# Patient Record
Sex: Female | Born: 1935
Health system: Southern US, Community
[De-identification: ages and names within clinical notes are randomized; demographics above are authoritative.]

## PROBLEM LIST (undated history)

## (undated) DIAGNOSIS — I10 Essential (primary) hypertension: Secondary | ICD-10-CM

## (undated) DIAGNOSIS — I272 Pulmonary hypertension, unspecified: Secondary | ICD-10-CM

## (undated) DIAGNOSIS — I4891 Unspecified atrial fibrillation: Secondary | ICD-10-CM

## (undated) DIAGNOSIS — I442 Atrioventricular block, complete: Secondary | ICD-10-CM

## (undated) DIAGNOSIS — J189 Pneumonia, unspecified organism: Secondary | ICD-10-CM

## (undated) HISTORY — DX: Atrioventricular block, complete: I44.2

## (undated) HISTORY — PX: ABDOMINAL HYSTERECTOMY: SHX81

## (undated) HISTORY — PX: BREAST SURGERY: SHX581

## (undated) HISTORY — PX: EYE SURGERY: SHX253

## (undated) HISTORY — DX: Pulmonary hypertension, unspecified: I27.20

---

## 2000-03-23 ENCOUNTER — Inpatient Hospital Stay (HOSPITAL_COMMUNITY): Admission: AD | Admit: 2000-03-23 | Discharge: 2000-03-24 | Payer: Self-pay | Admitting: Cardiology

## 2003-10-03 ENCOUNTER — Inpatient Hospital Stay (HOSPITAL_COMMUNITY): Admission: RE | Admit: 2003-10-03 | Discharge: 2003-10-07 | Payer: Self-pay | Admitting: Obstetrics and Gynecology

## 2005-02-23 IMAGING — CT CT ABDOMEN W/ CM
1 of 3 series · 13 of 32 positions shown, 18 images · IV contrast (CONTRAST)
Comparison: none

CLINICAL DATA: Postop day three, status post hysterectomy.  
 CT ABDOMEN AND PELVIS WITH CONTRAST 10/06/03 AT 0077 HOURS
TECHNIQUE: After the intravenous injection of Omnipaque 300, 5 mm spiral images were obtained through the abdomen and pelvis.  
 ABDOMEN

[Series 9095: — · axial · 0.70mm/px · z∈[+1330,+1755]mm · 13 of 99 slices shown, 18 images]
[im 7/99  soft-tissue]
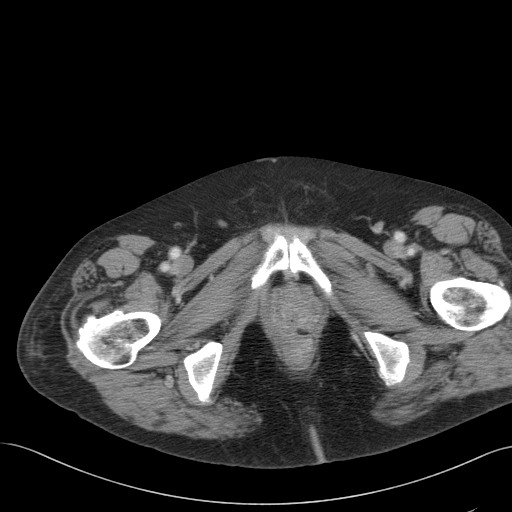
[im 7/99  bone]
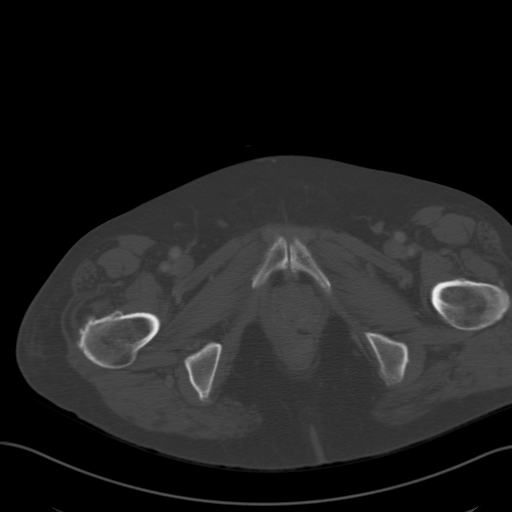
[im 13/99  soft-tissue]
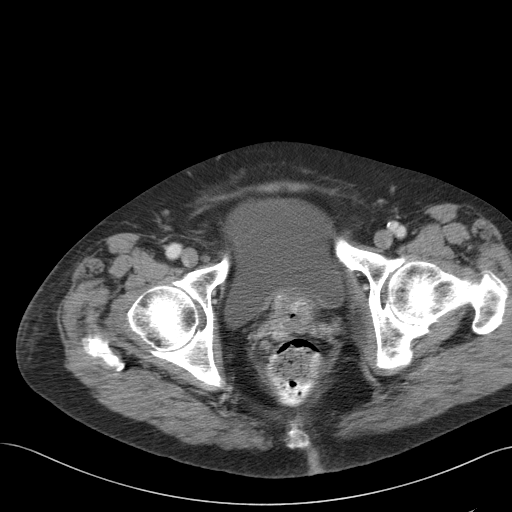
[im 25/99  soft-tissue]
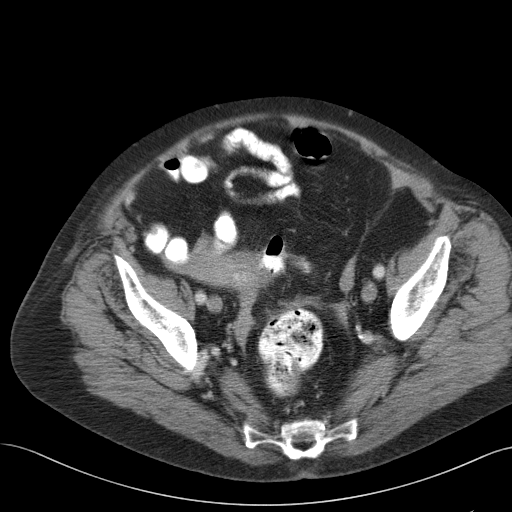
[im 31/99  soft-tissue]
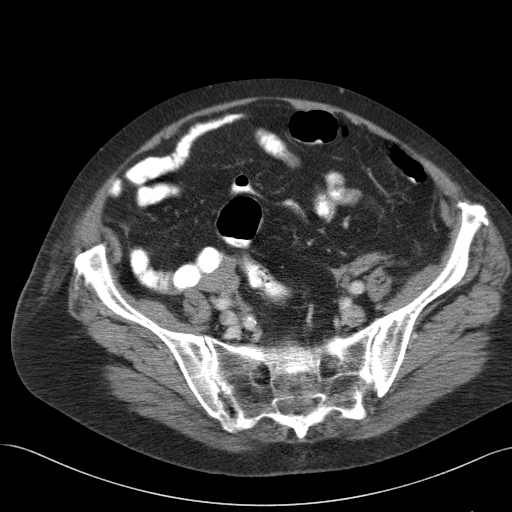
[im 37/99  soft-tissue]
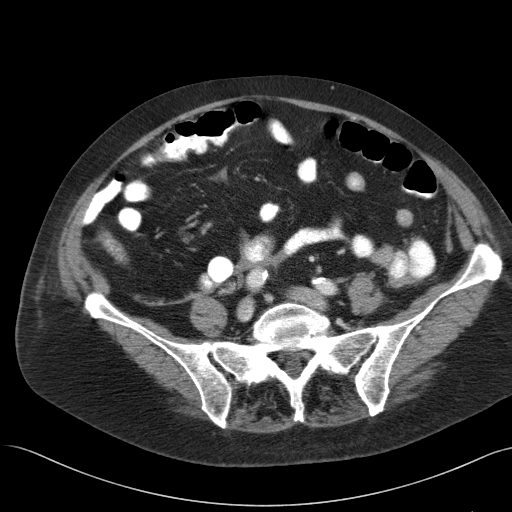
[im 43/99  soft-tissue]
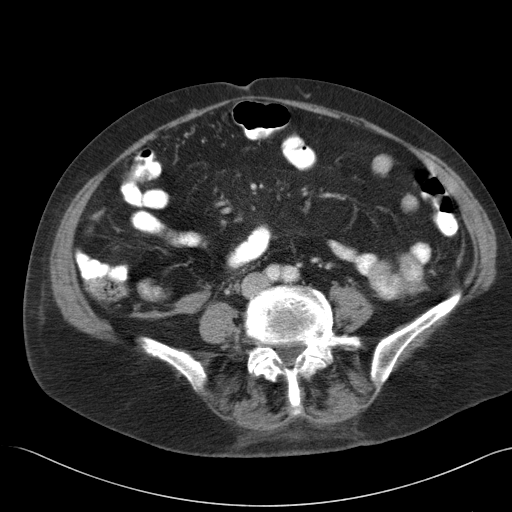
[im 56/99  soft-tissue]
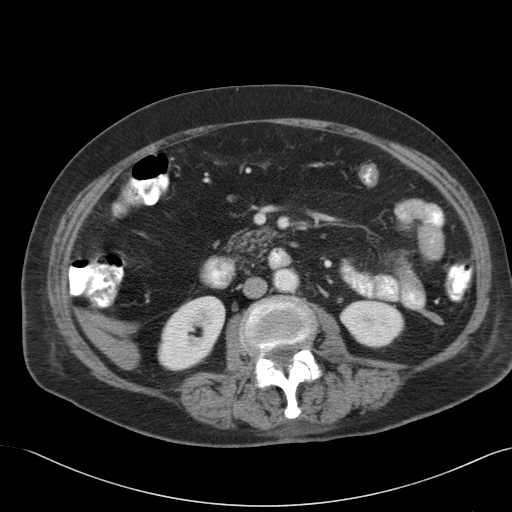
[im 62/99  soft-tissue]
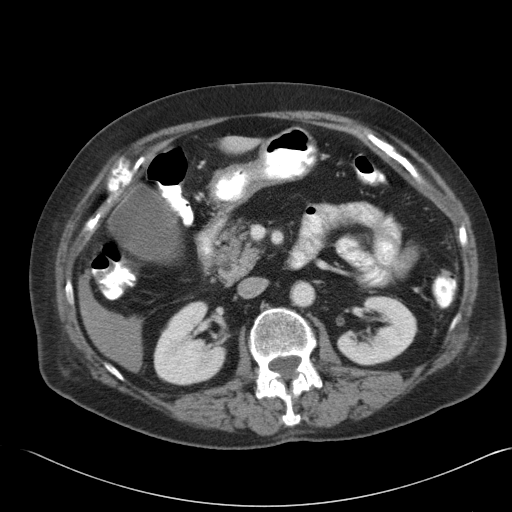
[im 68/99  soft-tissue]
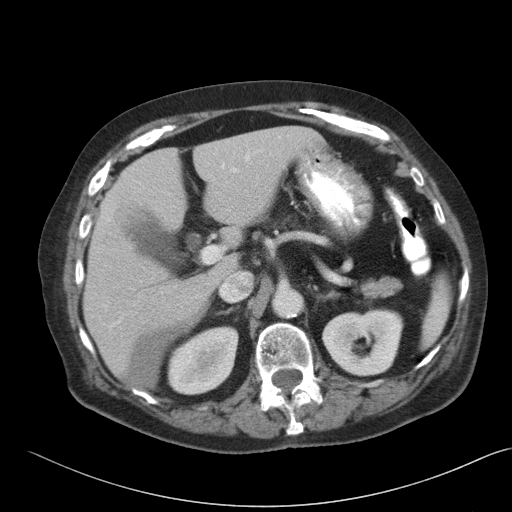
[im 68/99  bone]
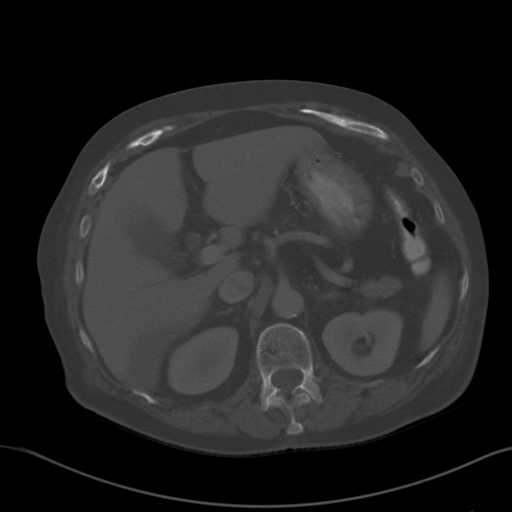
[im 74/99  soft-tissue]
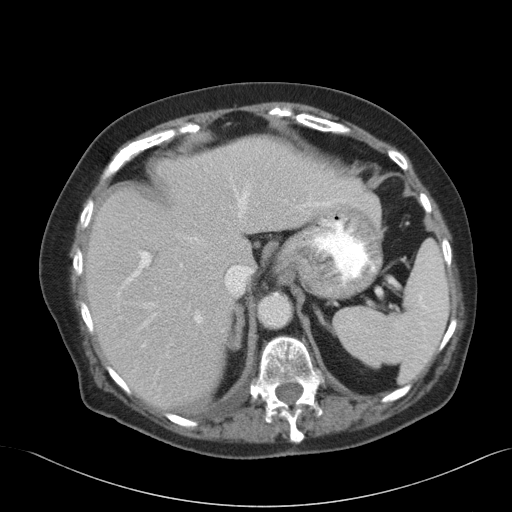
[im 74/99  lung]
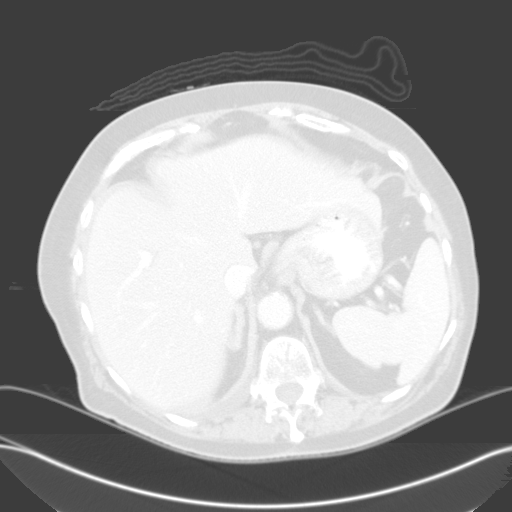
[im 80/99  lung]
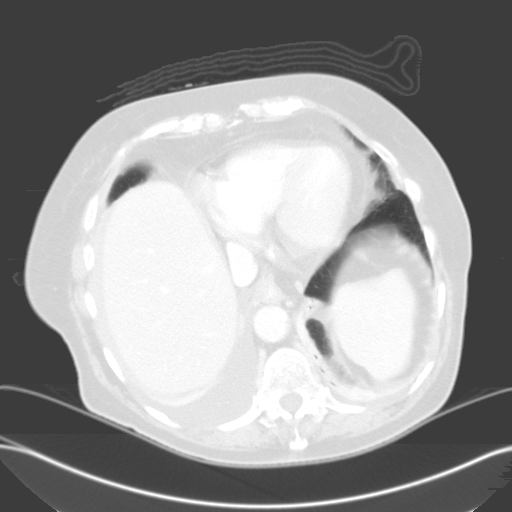
[im 86/99  soft-tissue]
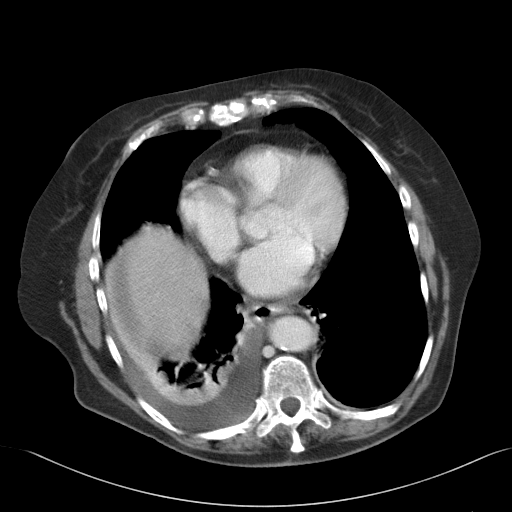
[im 86/99  lung]
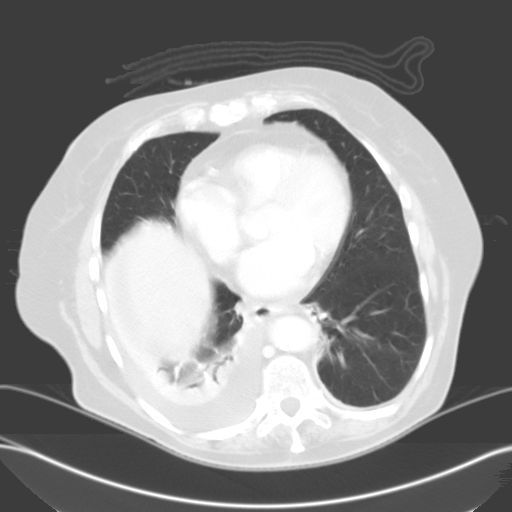
[im 92/99  soft-tissue]
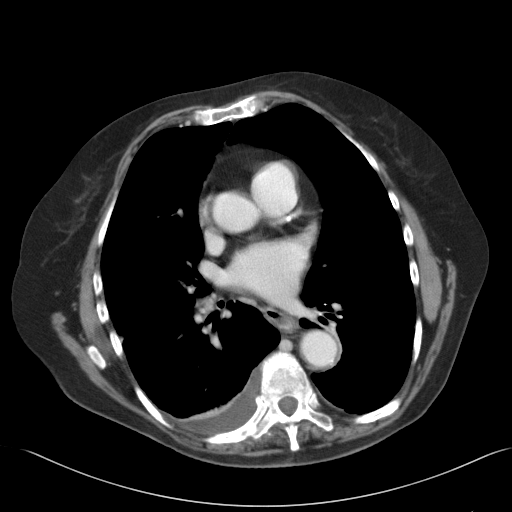
[im 92/99  lung]
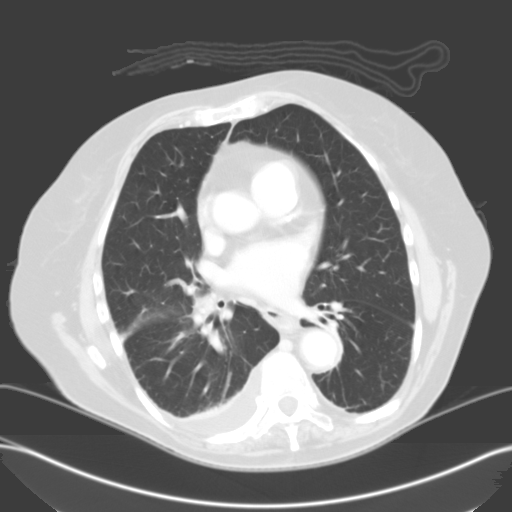

[13 of 32 positions shown; findings below may reference images not displayed]

FINDINGS: A small right pleural effusion is present with atelectatic change at the right base.  A tiny left effusion is present with minimal dependent atelectatic change at the left base.  Mild stranding is seen adjacent to the gallbladder with mild wall thickening of the gallbladder.  The gallbladder is somewhat distended.  High density free intraperitoneal fluid is seen in the right pericolic gutter.  The liver, spleen, pancreas are within normal limits.  A 1.8 X 0.9 cm right adrenal nodule is noted on image 28.  The common bile duct is prominent measuring 8 mm in caliber.  Negative abnormal adenopathy.  A subcentimeter hypodensity is seen in the right kidney on image 34.  This does not have the appearance of a simple cyst.  Solid mass is not excluded.  A small left adrenal nodule is also suspected.  
 IMPRESSION
 1.  High density free fluid in the right pericolic gutter which may simply represent postoperative hemorrhage. Extravasation from bowel contents is not entirely excluded.  Correlate clinically. 
 2.  Stranding and thickening of the gallbladder wall.  This may represent postsurgical change however inflammatory change of the gallbladder is not excluded.  Correlate clinically.  Common bile duct prominence is also noted. 
 3.  Bilateral effusions and bibasilar atelectasis.  
 4.  Hypodensity in the right kidney is seen.  Solid mass is not excluded.  Ultrasound is recommended.  
 PELVIS
 High density free fluid is seen layering in the pelvis.  Extending from the right adnexa into the right lower quadrant of the abdomen there is a heterogenous density within the free fluid.  Specks of high density material are seen and this may represent more acute hemorrhage.  The high density is not continuous and I do not think that this represents a retained sponge.  Gas is seen in the bladder.  The uterus is surgically absent.  The cervix has a somewhat heterogenous appearance and probably represents recent postsurgical state.  
 IMPRESSION
 1.  High density free fluid.  Please refer to abdomen CT for details.  
 2.  Heterogeneous high density within the high density free fluid likely more acute hemorrhage.  I do not think that this represents a retained lap sponge.  
 3.  Gas in the bladder. 
 4.  Postoperative appearance of the cervix.

## 2008-12-30 ENCOUNTER — Ambulatory Visit: Payer: Self-pay | Admitting: Cardiology

## 2011-01-28 HISTORY — PX: PACEMAKER INSERTION: SHX728

## 2011-02-04 ENCOUNTER — Inpatient Hospital Stay (HOSPITAL_COMMUNITY)
Admission: AD | Admit: 2011-02-04 | Discharge: 2011-02-10 | DRG: 244 | Disposition: A | Discharge status: Home Health | Payer: Medicare Other | Source: Other Acute Inpatient Hospital | Attending: Cardiology | Admitting: Cardiology

## 2011-02-04 ENCOUNTER — Other Ambulatory Visit: Payer: Self-pay

## 2011-02-04 ENCOUNTER — Inpatient Hospital Stay (HOSPITAL_COMMUNITY): Payer: Medicare Other

## 2011-02-04 ENCOUNTER — Encounter (HOSPITAL_COMMUNITY): Payer: Self-pay | Admitting: *Deleted

## 2011-02-04 DIAGNOSIS — R0789 Other chest pain: Secondary | ICD-10-CM | POA: Diagnosis present

## 2011-02-04 DIAGNOSIS — I4891 Unspecified atrial fibrillation: Secondary | ICD-10-CM | POA: Diagnosis present

## 2011-02-04 DIAGNOSIS — R5381 Other malaise: Secondary | ICD-10-CM | POA: Diagnosis present

## 2011-02-04 DIAGNOSIS — R002 Palpitations: Secondary | ICD-10-CM

## 2011-02-04 DIAGNOSIS — Z8249 Family history of ischemic heart disease and other diseases of the circulatory system: Secondary | ICD-10-CM

## 2011-02-04 DIAGNOSIS — E119 Type 2 diabetes mellitus without complications: Secondary | ICD-10-CM | POA: Diagnosis present

## 2011-02-04 DIAGNOSIS — I493 Ventricular premature depolarization: Secondary | ICD-10-CM | POA: Diagnosis present

## 2011-02-04 DIAGNOSIS — Z79899 Other long term (current) drug therapy: Secondary | ICD-10-CM

## 2011-02-04 DIAGNOSIS — R001 Bradycardia, unspecified: Secondary | ICD-10-CM | POA: Diagnosis present

## 2011-02-04 DIAGNOSIS — Z7982 Long term (current) use of aspirin: Secondary | ICD-10-CM

## 2011-02-04 DIAGNOSIS — R079 Chest pain, unspecified: Secondary | ICD-10-CM | POA: Diagnosis present

## 2011-02-04 DIAGNOSIS — I4949 Other premature depolarization: Secondary | ICD-10-CM | POA: Diagnosis present

## 2011-02-04 DIAGNOSIS — I10 Essential (primary) hypertension: Secondary | ICD-10-CM

## 2011-02-04 DIAGNOSIS — I498 Other specified cardiac arrhythmias: Principal | ICD-10-CM | POA: Diagnosis present

## 2011-02-04 DIAGNOSIS — Z7901 Long term (current) use of anticoagulants: Secondary | ICD-10-CM

## 2011-02-04 DIAGNOSIS — Z833 Family history of diabetes mellitus: Secondary | ICD-10-CM

## 2011-02-04 HISTORY — DX: Pneumonia, unspecified organism: J18.9

## 2011-02-04 HISTORY — DX: Unspecified atrial fibrillation: I48.91

## 2011-02-04 HISTORY — DX: Essential (primary) hypertension: I10

## 2011-02-04 LAB — MRSA PCR SCREENING: MRSA by PCR: NEGATIVE

## 2011-02-04 MED ORDER — DONEPEZIL HCL 5 MG PO TABS
5.0000 mg | ORAL_TABLET | Freq: Every day | ORAL | Status: DC
  Administered 2011-02-05 – 2011-02-09 (×5): 5 mg via ORAL
  Filled 2011-02-04 (×6): qty 1

## 2011-02-04 MED ORDER — NITROGLYCERIN 0.4 MG SL SUBL: 0.4000 mg | SUBLINGUAL_TABLET | SUBLINGUAL | Status: DC | PRN

## 2011-02-04 MED ORDER — SODIUM CHLORIDE 0.9 % IJ SOLN
3.0000 mL | Freq: Two times a day (BID) | INTRAMUSCULAR | Status: DC
  Administered 2011-02-05 – 2011-02-07 (×6): 3 mL via INTRAVENOUS

## 2011-02-04 MED ORDER — SODIUM CHLORIDE 0.9 % IV SOLN
250.0000 mL | INTRAVENOUS | Status: DC
  Administered 2011-02-08: 500 mL via INTRAVENOUS

## 2011-02-04 MED ORDER — SODIUM CHLORIDE 0.9 % IJ SOLN: 3.0000 mL | INTRAMUSCULAR | Status: DC | PRN

## 2011-02-04 MED ORDER — ENALAPRIL MALEATE 2.5 MG PO TABS
2.5000 mg | ORAL_TABLET | Freq: Every day | ORAL | Status: DC
  Administered 2011-02-05: 2.5 mg via ORAL
  Filled 2011-02-04 (×2): qty 1

## 2011-02-04 MED ORDER — BIOTENE DRY MOUTH MT LIQD
15.0000 mL | Freq: Two times a day (BID) | OROMUCOSAL | Status: DC
  Administered 2011-02-04 – 2011-02-10 (×11): 15 mL via OROMUCOSAL

## 2011-02-04 MED ORDER — INSULIN ASPART 100 UNIT/ML ~~LOC~~ SOLN
0.0000 [IU] | Freq: Three times a day (TID) | SUBCUTANEOUS | Status: DC
  Administered 2011-02-05: 2 [IU] via SUBCUTANEOUS
  Administered 2011-02-07: 1 [IU] via SUBCUTANEOUS
  Filled 2011-02-04: qty 3

## 2011-02-04 MED ORDER — DOCUSATE SODIUM 100 MG PO CAPS
100.0000 mg | ORAL_CAPSULE | Freq: Every evening | ORAL | Status: DC | PRN
  Administered 2011-02-05 – 2011-02-09 (×3): 100 mg via ORAL
  Filled 2011-02-04 (×4): qty 1

## 2011-02-04 MED ORDER — ONDANSETRON HCL 4 MG/2ML IJ SOLN: 4.0000 mg | Freq: Four times a day (QID) | INTRAMUSCULAR | Status: DC | PRN

## 2011-02-04 MED ORDER — CALCIUM CARBONATE-VITAMIN D 250-125 MG-UNIT PO TABS
1.0000 | ORAL_TABLET | Freq: Every day | ORAL | Status: DC
  Filled 2011-02-04: qty 1

## 2011-02-04 MED ORDER — ACETAMINOPHEN 325 MG PO TABS: 650.0000 mg | ORAL_TABLET | ORAL | Status: DC | PRN

## 2011-02-04 MED ORDER — ASPIRIN EC 325 MG PO TBEC
325.0000 mg | DELAYED_RELEASE_TABLET | Freq: Every day | ORAL | Status: DC
  Administered 2011-02-05: 325 mg via ORAL
  Filled 2011-02-04 (×2): qty 1

## 2011-02-04 MED ORDER — LORATADINE 10 MG PO TABS
10.0000 mg | ORAL_TABLET | Freq: Every day | ORAL | Status: DC
  Administered 2011-02-05 – 2011-02-10 (×6): 10 mg via ORAL
  Filled 2011-02-04 (×6): qty 1

## 2011-02-04 MED ORDER — HYDROCHLOROTHIAZIDE 12.5 MG PO CAPS
12.5000 mg | ORAL_CAPSULE | Freq: Every day | ORAL | Status: DC
  Administered 2011-02-05: 12.5 mg via ORAL
  Filled 2011-02-04 (×2): qty 1

## 2011-02-04 MED ORDER — PANTOPRAZOLE SODIUM 40 MG PO TBEC
40.0000 mg | DELAYED_RELEASE_TABLET | Freq: Every day | ORAL | Status: DC
  Administered 2011-02-05 – 2011-02-10 (×6): 40 mg via ORAL
  Filled 2011-02-04 (×6): qty 1

## 2011-02-04 MED ORDER — HEPARIN SODIUM (PORCINE) 5000 UNIT/ML IJ SOLN
5000.0000 [IU] | Freq: Three times a day (TID) | INTRAMUSCULAR | Status: DC
Start: 2011-02-04 — End: 2011-02-05
  Administered 2011-02-05 (×2): 5000 [IU] via SUBCUTANEOUS
  Filled 2011-02-04 (×5): qty 1

## 2011-02-04 NOTE — H&P (Signed)
Cardiology H&P  PCP: Selinda Flavin Cardiologist:   Clinical Summary Ms. Lovin is a 77 y.o.female with atrial fibrillation, HTN, and history of noncardiac chest pain who is transferred from [facility] with 2-3 months of intermittent palpitaitons and fluttering in her chest.  In addition, tonight she noted chest pressure that lasted for 20-30 minutes associated with the fluttering.  She does not think her heart was racing.  She reports recent feelings of weakness and occasional lightheadedness but has not passed out.  She has not had any SOB, orthopnea, or PND.  She has not had any exertional angina.  She does not remember the symptoms she was having at the time of her LHC in 2001 but she was found to have nonobstructive dz but TIMI II flow in the LAD and RCA thought to be due to small vessel dz.   No Known Allergies  Home Medications Prescriptions prior to admission  Medication Sig Dispense Refill  . aspirin 325 MG tablet Take 325 mg by mouth daily.        Marland Kitchen b complex vitamins tablet Take 1 tablet by mouth daily.        . Calcium Carbonate-Vitamin D (CALCIUM + D PO) Take by mouth.        . diltiazem (CARDIZEM CD) 180 MG 24 hr capsule Take 180 mg by mouth daily.        . Enalapril Maleate (VASOTEC PO) Take by mouth.        . esomeprazole (NEXIUM) 40 MG capsule Take 40 mg by mouth daily before breakfast.        . glipiZIDE (GLUCOTROL) 5 MG tablet Take 5 mg by mouth daily before breakfast.       . hydrochlorothiazide (MICROZIDE) 12.5 MG capsule Take 12.5 mg by mouth daily.        Marland Kitchen loratadine (CLARITIN) 10 MG tablet Take 10 mg by mouth daily.        . Multiple Vitamin (MULTIVITAMIN) capsule Take 1 capsule by mouth daily.        Marland Kitchen docusate sodium (COLACE) 100 MG capsule Take 100 mg by mouth at bedtime as needed.        . donepezil (ARICEPT) 10 MG tablet Take 10 mg by mouth every other day.        . donepezil (ARICEPT) 5 MG tablet Take 5 mg by mouth every other day.        .  nitrofurantoin, macrocrystal-monohydrate, (MACROBID) 100 MG capsule Take 100 mg by mouth daily.          Past Medical History  Diagnosis Date  . Diabetes mellitus   . Pneumonia   . Atrial fibrillation     not on coumadin as outpatient  . Hypertension   . Chest pain 2001    nonobstructive dz by LHC in 2001    Past Surgical History  Procedure Date  . Abdominal hysterectomy   . Breast surgery     62 yrs ago after childbirth breast lanced  . Eye surgery     Cataract Sx     Family History  Problem Relation Age of Onset  . Diabetes Father   . Coronary artery disease Brother     AMI in 86s    Social History Ms. Platter reports that she has never smoked. She does not have any smokeless tobacco history on file. Ms. Winslett reports that she does not drink alcohol.  Review of Systems Full review of systems is negative except as noted  in the h&p  Physical Examination Temp:  [98.1 F (36.7 C)] 98.1 F (36.7 C) (11/08 2115) Resp:  [9-22] 20  (11/08 2200) BP: (128-131)/(50-55) 128/55 mmHg (11/08 2200) SpO2:  [94 %-98 %] 98 % (11/08 2200) Weight:  [64 kg (141 lb 1.5 oz)] 141 lb 1.5 oz (64 kg) (11/08 2115)  Gen: NAD, elderly HEENT: normocephalic, atraumatic, EOMI, nonicteric sclera Neck: supple Lymph: no LAD CV: RRR with soft 2/6 holosystolic murmur at apex, no JVD Lungs: CTA biltaterally Abd: soft/nt/nd Ext: no edema, intact distal pulses Neuro: answers questions well, moves all ext well, CN intact Skin: no rashes  Testing Labs pending, Outside records reviewed, hgb/hct normal, renal function normal, cardiac enzymes x 1 normal  ECG Atrial fibrillation with bigeminy, slow ventricular response, nonspecific TWA  Imaging CXR pending  A/P:  77 yo WF with chronic atrial fibrillation and HTN here with chest pressure and intermittent palpitations as well as what appears to be symptomatic bradycardia.  I suspect most of her symptoms are due to bradycardia but will also need to  consider ACS.  1. Bradycardia and chronic afib: presyncope and fatigue.  HR in the 30s to 40s.   - d/c home diltiazem 180 daily - telemetry - will probably need PPM, particuarly if she proves to have RVR/bradycardia - check 2D TTE in AM - will need to discuss coumadin with patient/family, no definite contraindications and CHADS2-Vasc score is 5.  She is currently on aspirin from her PCP  2. Chest pain: suspect this is related to her bradycardia but could also represent unstable angina - r/o for AMI with serial markers - ASA 325 daily - will not anticoagulate unless pain returns or enzymes abn  3. DM:  - Ha1c - will hold glipizide while she is NPO in the AM  4. HTN: well controlled - will continue HCTZ as well as enalapril 2.5mg  (home does unknown currently but daughters will get dose in AM)

## 2011-02-04 NOTE — Progress Notes (Signed)
Received pt via [facility] from [facility]. Dr. Charm Barges notified of arrival. Family at bedside.

## 2011-02-05 DIAGNOSIS — I369 Nonrheumatic tricuspid valve disorder, unspecified: Secondary | ICD-10-CM

## 2011-02-05 DIAGNOSIS — I4891 Unspecified atrial fibrillation: Secondary | ICD-10-CM

## 2011-02-05 DIAGNOSIS — I498 Other specified cardiac arrhythmias: Secondary | ICD-10-CM

## 2011-02-05 LAB — COMPREHENSIVE METABOLIC PANEL
ALT: 23 U/L (ref 0–35)
AST: 30 U/L (ref 0–37)
Alkaline Phosphatase: 101 U/L (ref 39–117)
Calcium: 9 mg/dL (ref 8.4–10.5)
Potassium: 4.3 mEq/L (ref 3.5–5.1)
Sodium: 141 mEq/L (ref 135–145)
Total Protein: 5.9 g/dL — ABNORMAL LOW (ref 6.0–8.3)

## 2011-02-05 LAB — LIPID PANEL
Cholesterol: 132 mg/dL (ref 0–200)
HDL: 71 mg/dL (ref >39)
Total CHOL/HDL Ratio: 1.9 RATIO
Triglycerides: 33 mg/dL (ref <150)
VLDL: 7 mg/dL (ref 0–40)

## 2011-02-05 LAB — TSH: TSH: 5.561 u[IU]/mL — ABNORMAL HIGH (ref 0.350–4.500)

## 2011-02-05 LAB — APTT: aPTT: 38 seconds — ABNORMAL HIGH (ref 24–37)

## 2011-02-05 LAB — DIFFERENTIAL
Basophils Relative: 0 % (ref 0–1)
Eosinophils Absolute: 0.1 10*3/uL (ref 0.0–0.7)
Lymphs Abs: 1.7 10*3/uL (ref 0.7–4.0)
Neutro Abs: 2.6 10*3/uL (ref 1.7–7.7)
Neutrophils Relative %: 51 % (ref 43–77)

## 2011-02-05 LAB — CBC
Hemoglobin: 12.8 g/dL (ref 12.0–15.0)
MCH: 30.3 pg (ref 26.0–34.0)
Platelets: 205 10*3/uL (ref 150–400)
RBC: 4.23 MIL/uL (ref 3.87–5.11)

## 2011-02-05 LAB — CARDIAC PANEL(CRET KIN+CKTOT+MB+TROPI)
CK, MB: 2.8 ng/mL (ref 0.3–4.0)
CK, MB: 2.2 ng/mL (ref 0.3–4.0)
Total CK: 47 U/L (ref 7–177)
Troponin I: <0.3 ng/mL (ref <0.30)

## 2011-02-05 LAB — PROTIME-INR
INR: 1.19 (ref 0.00–1.49)
Prothrombin Time: 15.4 seconds — ABNORMAL HIGH (ref 11.6–15.2)

## 2011-02-05 LAB — GLUCOSE, CAPILLARY: Glucose-Capillary: 89 mg/dL (ref 70–99)

## 2011-02-05 MED ORDER — HEPARIN (PORCINE) IN NACL 100-0.45 UNIT/ML-% IJ SOLN
12.0000 [IU]/kg/h | INTRAMUSCULAR | Status: DC
  Administered 2011-02-05: 12 [IU]/kg/h via INTRAVENOUS
  Filled 2011-02-05: qty 250

## 2011-02-05 MED ORDER — CALCIUM CARBONATE 1250 (500 CA) MG PO TABS
0.5000 | ORAL_TABLET | Freq: Every day | ORAL | Status: DC
  Administered 2011-02-05 – 2011-02-10 (×6): 250 mg via ORAL
  Filled 2011-02-05 (×7): qty 0.5

## 2011-02-05 MED ORDER — HEPARIN BOLUS VIA INFUSION
1500.0000 [IU] | Freq: Once | INTRAVENOUS | Status: AC
  Administered 2011-02-05: 1500 [IU] via INTRAVENOUS

## 2011-02-05 NOTE — Progress Notes (Signed)
ANTICOAGULATION CONSULT NOTE - Initial Consult  Pharmacy Consult for Heparin Indication: chest pain/ACS  No Known Allergies  Patient Measurements: Height: 5\' 6"  (167.6 cm) Weight: 141 lb 1.5 oz (64 kg) IBW/kg (Calculated) : 59.3   Vital Signs: Temp: 98.4 F (36.9 C) (11/09 0800) Temp src: Oral (11/09 0800) BP: 151/61 mmHg (11/09 0900)  Labs:  Basename 02/05/11 0556 02/05/11 0004 02/05/11 0003  HGB -- -- 12.8  HCT -- -- 37.8  PLT -- -- 205  APTT -- -- 38*  LABPROT -- -- 15.4*  INR -- -- 1.19  HEPARINUNFRC -- -- --  CREATININE -- -- 0.69  CKTOTAL 47 47 --  CKMB 2.2 2.8 --  TROPONINI <0.30 <0.30 --   Estimated Creatinine Clearance: 42.9 ml/min (by C-G formula based on Cr of 0.69).  Medical History: Past Medical History  Diagnosis Date  . Diabetes mellitus   . Pneumonia   . Atrial fibrillation     not on coumadin as outpatient  . Hypertension   . Chest pain 2001    nonobstructive dz by LHC in 2001    Medications:  Scheduled:    . antiseptic oral rinse  15 mL Mouth Rinse BID  . aspirin EC  325 mg Oral Daily  . calcium carbonate  0.5 tablet Oral Daily  . donepezil  5 mg Oral QHS  . enalapril  2.5 mg Oral Daily  . heparin  1,500 Units Intravenous Once  . hydrochlorothiazide  12.5 mg Oral Daily  . insulin aspart  0-9 Units Subcutaneous TID WC  . loratadine  10 mg Oral Daily  . pantoprazole  40 mg Oral Daily  . sodium chloride  3 mL Intravenous Q12H  . DISCONTD: calcium-vitamin D  1 tablet Oral Daily  . DISCONTD: heparin  5,000 Units Subcutaneous Q8H   Infusions:    . sodium chloride 250 mL (02/05/11 0800)  . heparin      Assessment: [name] is a 77 year old woman with chest pain who cannot be ruled out for unstable angina/NSTEMI with atrial fibrillation (CHADS2=3). ECHO is pending. Chest pain is atypical and CE negative as of now. Bradycardia with heart rates in the 30-40 range as of now. CBC okay for now, no signs of bleeding. Will give bolus of 1500  units (~1/2 bolus) d/t heparin sq dose this am and patient's age. Goal of Therapy:  Heparin level 0.3-0.7 units/ml   Plan:  1500 units bolus Drip at 750 units (7.5 ml) per hour Heparin level at 1800 Daily heparin level Daily CBC  Katrinka Blazing, [place] R 02/05/2011,10:26 AM

## 2011-02-05 NOTE — Progress Notes (Signed)
As above, patient seen and examined; she complains of weakness and chest heaviness; she remains in atrial fibrillation with slow ventricular response. She took her last dose of cardizem yesterday AM. Hold and follow telemetry; she may eventually need pacemaker. Multiple embolic risk factors; add heparin; she will ultimately need coumadin. Await echo results. Her chest pain is atypical and enzymes negative. Olga Millers

## 2011-02-05 NOTE — Progress Notes (Signed)
  Echocardiogram 2D Echocardiogram has been performed.  HAIRSTON, Arrayah Connors 02/05/2011, 6:04 PM

## 2011-02-05 NOTE — Progress Notes (Signed)
ANTICOAGULATION CONSULT NOTE - Follow Up Consult  Pharmacy Consult for heparin Indication: chest pain/ACS  No Known Allergies  Patient Measurements: Height: 5\' 6"  (167.6 cm) Weight: 141 lb 1.5 oz (64 kg) IBW/kg (Calculated) : 59.3  Adjusted Body Weight:    Vital Signs: Temp: 98 F (36.7 C) (11/09 1900) Temp src: Oral (11/09 1900) BP: 161/52 mmHg (11/09 1900)  Labs:  Basename 02/05/11 1757 02/05/11 0556 02/05/11 0004 02/05/11 0003  HGB -- -- -- 12.8  HCT -- -- -- 37.8  PLT -- -- -- 205  APTT -- -- -- 38*  LABPROT -- -- -- 15.4*  INR -- -- -- 1.19  HEPARINUNFRC 0.32 -- -- --  CREATININE -- -- -- 0.69  CKTOTAL -- 47 47 --  CKMB -- 2.2 2.8 --  TROPONINI -- <0.30 <0.30 --   Estimated Creatinine Clearance: 42.9 ml/min (by C-G formula based on Cr of 0.69).   Medications:  Scheduled:    . antiseptic oral rinse  15 mL Mouth Rinse BID  . aspirin EC  325 mg Oral Daily  . calcium carbonate  0.5 tablet Oral Daily  . donepezil  5 mg Oral QHS  . enalapril  2.5 mg Oral Daily  . heparin  1,500 Units Intravenous Once  . hydrochlorothiazide  12.5 mg Oral Daily  . insulin aspart  0-9 Units Subcutaneous TID WC  . loratadine  10 mg Oral Daily  . pantoprazole  40 mg Oral Daily  . sodium chloride  3 mL Intravenous Q12H  . DISCONTD: calcium-vitamin D  1 tablet Oral Daily  . DISCONTD: heparin  5,000 Units Subcutaneous Q8H   Infusions:    . sodium chloride 250 mL (02/05/11 1900)  . heparin 12 Units/kg/hr (02/05/11 1049)    Assessment: 77 yo who was admitted for CP. Heparin is therapeutic. Will increase dose slightly.  Goal of Therapy:  Heparin level 0.3-0.7 units/ml   Plan:  infusion rate 850 units/hr F/u with AM heparin level  Ulyses Southward [place] 02/05/2011,7:53 PM

## 2011-02-05 NOTE — Progress Notes (Addendum)
Subjective:  She reports some heaviness in the left side of her chest. Denies any SOB or dizziness.  Objective:  Vital Signs in the last 24 hours: Temp:  [98 F (36.7 C)-98.2 F (36.8 C)] 98 F (36.7 C) (11/09 0400) Resp:  [9-22] 18  (11/09 0600) BP: (128-157)/(48-55) 153/52 mmHg (11/09 0600) SpO2:  [94 %-98 %] 97 % (11/09 0600) Weight:  [141 lb 1.5 oz (64 kg)] 141 lb 1.5 oz (64 kg) (11/08 2115)  Intake/Output from previous day: 11/08 0701 - 11/09 0700 In: 200 [P.O.:120; I.V.:80] Out: 350 [Urine:350]  Physical Exam: Pt is alert and oriented, NAD HEENT: normal Neck: JVP - normal, carotids 2+= without bruits Lungs: CTA bilaterally CV: bradycardia, irregular rhythm without murmur or gallop Abd: soft, NT, Positive BS, no hepatomegaly Ext: no C/C/E, distal pulses intact and equal Skin: warm/dry no rash   Lab Results:  Basename 02/05/11 0003  WBC 5.0  HGB 12.8  PLT 205    Basename 02/05/11 0003  NA 141  K 4.3  CL 105  CO2 26  GLUCOSE 90  BUN 13  CREATININE 0.69    Basename 02/05/11 0004  TROPONINI <0.30   Hepatic Function Panel  Basename 02/05/11 0003  PROT 5.9*  ALBUMIN 3.3*  AST 30  ALT 23  ALKPHOS 101  BILITOT 0.5  BILIDIR --  IBILI --   No results found for this basename: CHOL in the last 72 hours No results found for this basename: PROTIME in the last 72 hours  Imaging: 11/8; CXR: cardiomegaly without edema.  Cardiac Studies: LHC in 2001 but she was found to have nonobstructive dz but TIMI II flow in the LAD and RCA thought to be due to small vessel dz  EKG : atrial fibrillation with slow ventricular response. Assessment/Plan:  [name] is a 77 y.o.female with atrial fibrillation, HTN, and history of noncardiac chest pain who is transferred from [facility]  on 11/8 with 2-3 months of intermittent palpitaitons and fluttering in her chest. In addition,she also reports having some chest discomfort that she describes as pressure like pain starting  on the day of her admsission  1.Bradycardia with chronic atrial fibrillation:  (02/04/2011): Her HR continues to be in 30- 40 's. Her diltiazem was held with the admission yesterday.    Plan: F/U 2 D echo.            Given her CHADS2 Vasc score of 5( 1= HYPERTENSION, 1= DM,1= female gender,  2=Age), will discuss with the patient/ family about coumadin.            Given her symptomatic bradycardia, she would need permanent pacemaker.   2.Chest pain (02/04/2011): Likely related to bradycardia but unstable angina can not be ruled out. She continues to feel chest pressure this morning. First set of CE has been negative      Plan: Follow up CE            Continue ASA  3. DM: CBG's well controlled.     Plan: Continue SSI               F/U AIC  4. HTN: BP stable on home meds.               Continue home meds.           [name], M.D. 02/05/2011, 6:11 AM

## 2011-02-06 DIAGNOSIS — I10 Essential (primary) hypertension: Secondary | ICD-10-CM

## 2011-02-06 LAB — GLUCOSE, CAPILLARY
Glucose-Capillary: 73 mg/dL (ref 70–99)
Glucose-Capillary: 125 mg/dL — ABNORMAL HIGH (ref 70–99)

## 2011-02-06 LAB — HEPARIN LEVEL (UNFRACTIONATED): Heparin Unfractionated: 0.22 IU/mL — ABNORMAL LOW (ref 0.30–0.70)

## 2011-02-06 LAB — CBC
HCT: 40.1 % (ref 36.0–46.0)
RBC: 4.46 MIL/uL (ref 3.87–5.11)
RDW: 15.4 % (ref 11.5–15.5)
WBC: 5.6 10*3/uL (ref 4.0–10.5)

## 2011-02-06 MED ORDER — HYDROCHLOROTHIAZIDE 12.5 MG PO CAPS: 25.0000 mg | ORAL_CAPSULE | Freq: Every day | ORAL | Status: DC

## 2011-02-06 MED ORDER — HYDROCHLOROTHIAZIDE 25 MG PO TABS
25.0000 mg | ORAL_TABLET | Freq: Every day | ORAL | Status: DC
  Administered 2011-02-06 – 2011-02-10 (×5): 25 mg via ORAL
  Filled 2011-02-06 (×5): qty 1

## 2011-02-06 MED ORDER — HEPARIN (PORCINE) IN NACL 100-0.45 UNIT/ML-% IJ SOLN
1050.0000 [IU]/h | INTRAMUSCULAR | Status: DC
  Administered 2011-02-06 (×2): 850 [IU]/h via INTRAVENOUS
  Administered 2011-02-07: 950 [IU]/h via INTRAVENOUS
  Filled 2011-02-06 (×4): qty 250

## 2011-02-06 MED ORDER — LISINOPRIL 10 MG PO TABS
10.0000 mg | ORAL_TABLET | Freq: Every day | ORAL | Status: DC
  Administered 2011-02-06 – 2011-02-10 (×5): 10 mg via ORAL
  Filled 2011-02-06 (×5): qty 1

## 2011-02-06 NOTE — Plan of Care (Signed)
Problem: Phase I Progression Outcomes Goal: Initial discharge plan identified For possible pacemaker on Monday then possible discharge Tues.

## 2011-02-06 NOTE — Progress Notes (Signed)
[name]  77 y.o.  female  Subjective: Continues to note vague intermittent chest heaviness; no dyspnea; no lightheadedness or syncope.  + weakness and fatigue.  Allergy: Review of patient's allergies indicates no known allergies.  Objective: Vital signs in last 24 hours: Temp:  [97.5 F (36.4 C)-98.3 F (36.8 C)] 98.1 F (36.7 C) (11/10 0700) Resp:  [15-21] 17  (11/10 0800) BP: (133-186)/(44-92) 186/54 mmHg (11/10 0700) SpO2:  [92 %-98 %] 96 % (11/10 0800)  141 lb 1.5 oz (64 kg) Body mass index is 22.77 kg/(m^2).  Weight change:  Last BM Date: 02/05/11  Intake/Output from previous day: 11/09 0701 - 11/10 0700 In: 1420 [P.O.:1000; I.V.:420] Out: 1950 [Urine:1950]  General-Well developed; no acute distress; thin Neck-No JVD, no carotid bruits Lungs: few basilar rales; normal I:E ratio; mild kyphosis Cardiovascular-normal PMI; normal S1 and S2; G2/6 holosystolic apical murmur. Abdomen-normal bowel sounds; soft and non-tender without masses or organomegaly Skin-Warm, no significant lesions; mild chronic stasis changes in LEs Extremities-distal pulses intact; 1/2+ edema  Telemetry:  AF, ventricular rate approximately 50 and slightly increased  Lab Results: Cardiac Markers:   Basename 02/05/11 0556 02/05/11 0004  TROPONINI <0.30 <0.30   CBC:   Basename 02/06/11 0545 02/05/11 0003  WBC 5.6 5.0  HGB 13.2 12.8  HCT 40.1 37.8  PLT 214 205   BMET:  Basename 02/05/11 0003  NA 141  K 4.3  CL 105  CO2 26  GLUCOSE 90  BUN 13  CREATININE 0.69  CALCIUM 9.0   Hepatic Function:   Basename 02/05/11 0003  PROT 5.9*  ALBUMIN 3.3*  AST 30  ALT 23  ALKPHOS 101  BILITOT 0.5  BILIDIR --  IBILI --   GFR:  Estimated Creatinine Clearance: 42.9 ml/min (by C-G formula based on Cr of 0.69). Lipids:   Basename 02/05/11 0556  CHOL 132  TRIG 33  HDL 71    EKG:  AF, slow response; low voltage in limb leads; NSSTT abnormality-inferior leads.   Imaging  Studies/Results: Dg Chest Portable 1 View  02/05/2011  *RADIOLOGY REPORT*  Clinical Data: Chest pain  PORTABLE CHEST - 1 VIEW  Comparison: None.  Findings: The heart is markedly enlarged.  Pulmonary vascularity is within normal limits.  Thorax is rotated to the right. Mid and upper lungs are grossly clear.  No pneumothorax or pleural effusion. Mild bibasilar atelectasis.  IMPRESSION: Cardiomegaly without edema.  Original Report Authenticated By: Donavan Burnet, M.D.    Imaging: Imaging results have been reviewed  Medications: I have reviewed the patient's current medications.  Infusions:     . sodium chloride 250 mL (02/06/11 0900)  . heparin 750 Units (02/06/11 0900)   Assessment/Plan: Bradycardia persists, somewhat improved.  Chronic chest discomfort could be related to ischemia, either from decreased myocardial perfusion related to arrhythmia or CAD.  Doubt the former, as diastole is lengthened with these very slow heart rates.  Hypertension and pedal edema suboptimally controlled.  Will increase dose of ACE inhibitor and diuretic.    OK for transfer to floor.  Continued observation to determine if she will require pacing.  Will review echo and transfer to monitored bed.  LOS: 2 days   Ukiah Bing 02/06/2011, 10:12 AM

## 2011-02-06 NOTE — Progress Notes (Addendum)
ANTICOAGULATION CONSULT NOTE - Follow Up Consult  Pharmacy Consult for heparin Indication: chest pain/ACS  No Known Allergies  Patient Measurements: Height: 5\' 6"  (167.6 cm) Weight: 141 lb 1.5 oz (64 kg) IBW/kg (Calculated) : 59.3     Vital Signs: Temp: 98.1 F (36.7 C) (11/10 0700) Temp src: Oral (11/10 0700) BP: 164/52 mmHg (11/10 1100)  Labs:  Basename 02/06/11 0545 02/05/11 1757 02/05/11 0556 02/05/11 0004 02/05/11 0003  HGB 13.2 -- -- -- 12.8  HCT 40.1 -- -- -- 37.8  PLT 214 -- -- -- 205  APTT -- -- -- -- 38*  LABPROT -- -- -- -- 15.4*  INR -- -- -- -- 1.19  HEPARINUNFRC 0.22* 0.32 -- -- --  CREATININE -- -- -- -- 0.69  CKTOTAL -- -- 47 47 --  CKMB -- -- 2.2 2.8 --  TROPONINI -- -- <0.30 <0.30 --   Estimated Creatinine Clearance: 42.9 ml/min (by C-G formula based on Cr of 0.69).    Assessment: 77 yo who was admitted for CP. Heparin level 0.22 < goal 0.3-0.7 drip rate 750 uts/hr.   H/H stable no bleeding noted.  Still c/o slight CP with neg cardiac enzymes.  BP slightly elevated - inc ACEI, LE edema inc HCTZ -per cards  Goal of Therapy:  Heparin level 0.3-0.7 units/ml   Plan:  Increase Heparin drip 850 uts/hr check AML  [name] 02/06/2011,11:52 AM

## 2011-02-07 LAB — T4, FREE: Free T4: 1.36 ng/dL (ref 0.80–1.80)

## 2011-02-07 LAB — CBC
HCT: 41.8 % (ref 36.0–46.0)
Hemoglobin: 14.3 g/dL (ref 12.0–15.0)
MCH: 30.4 pg (ref 26.0–34.0)
MCHC: 34.2 g/dL (ref 30.0–36.0)

## 2011-02-07 LAB — GLUCOSE, CAPILLARY
Glucose-Capillary: 110 mg/dL — ABNORMAL HIGH (ref 70–99)
Glucose-Capillary: 118 mg/dL — ABNORMAL HIGH (ref 70–99)

## 2011-02-07 NOTE — Progress Notes (Signed)
Night shift RN reported bleeding from a small puncture like wound on RT lower Abd.  Pt states she thought she had "wet the bed" and when she awoke, there was blood everywhere.  Pressure dsg was applied and bleeding stopped.  Pharmacist and Gladis Riffle, NP notified this am.  Hep gtt currently infusing at 8.5cc/hr.   Sakshi Sermons, [place] Erp

## 2011-02-07 NOTE — Progress Notes (Signed)
ANTICOAGULATION CONSULT NOTE - Follow Up Consult  Pharmacy Consult for Heparin Indication: chest pain/ACS  No Known Allergies  Patient Measurements: Height: 5\' 6"  (167.6 cm) Weight: 138 lb 10.7 oz (62.9 kg) IBW/kg (Calculated) : 59.3   Vital Signs: Temp: 97.5 F (36.4 C) (11/11 0613) Temp src: Oral (11/11 0613) BP: 151/71 mmHg (11/11 0944) Pulse Rate: 45  (11/11 0944)  Labs:  Basename 02/07/11 0700 02/06/11 0545 02/05/11 1757 02/05/11 0556 02/05/11 0004 02/05/11 0003  HGB 14.3 13.2 -- -- -- --  HCT 41.8 40.1 -- -- -- 37.8  PLT 217 214 -- -- -- 205  APTT -- -- -- -- -- 38*  LABPROT -- -- -- -- -- 15.4*  INR -- -- -- -- -- 1.19  HEPARINUNFRC 0.20* 0.22* 0.32 -- -- --  CREATININE -- -- -- -- -- 0.69  CKTOTAL -- -- -- 47 47 --  CKMB -- -- -- 2.2 2.8 --  TROPONINI -- -- -- <0.30 <0.30 --   Estimated Creatinine Clearance: 42.9 ml/min (by C-G formula based on Cr of 0.69).   Medications:  Scheduled:    . antiseptic oral rinse  15 mL Mouth Rinse BID  . calcium carbonate  0.5 tablet Oral Daily  . donepezil  5 mg Oral QHS  . hydrochlorothiazide  25 mg Oral Daily  . insulin aspart  0-9 Units Subcutaneous TID WC  . lisinopril  10 mg Oral Daily  . loratadine  10 mg Oral Daily  . pantoprazole  40 mg Oral Daily  . sodium chloride  3 mL Intravenous Q12H    Assessment: 77 yo who was admitted for CP. Subtherapeutic heparin level 0.2 on drip rate 850 uts/hr. Pt bled overnight per nurse, which stopped with dressing compress. Team ok to continue heparin therapy. H/H/plt stable. Possible pacemaker tomorrow and discharge Tues. On ACEI, LE edema inc HCTZ -per cards   Goal of Therapy:  Heparin level 0.3-0.7 units/ml   Plan:  Increase Heparin drip 950 uts/hr check AM level. Monitor bleeding closely   Wyline Copas 02/07/2011,10:50 AM

## 2011-02-07 NOTE — Progress Notes (Addendum)
SUBJECTIVE: Complaints of transient sharp chest pain lasting a few seconds. Occasional chest pressure "every now and then."   Principal Problem:  *Bradycardia Active Problems:  Atrial fibrillation  PVC (premature ventricular contraction)  Chest pain  Hypertension  Filed Vitals:   02/06/11 2200 02/07/11 0613 02/07/11 0944 02/07/11 1338  BP: 174/63 193/71 151/71 204/85  Pulse: 51 40 45   Temp: 97.7 F (36.5 C) 97.5 F (36.4 C)    TempSrc: Oral Oral    Resp: 14 14    Height:      Weight:      SpO2: 93% 94%      Intake/Output Summary (Last 24 hours) at 02/07/11 1347 Last data filed at 02/07/11 1300  Gross per 24 hour  Intake 695.69 ml  Output   1250 ml  Net -554.31 ml     LABS: Basic Metabolic Panel:  Basename 02/05/11 0003  NA 141  K 4.3  CL 105  CO2 26  GLUCOSE 90  BUN 13  CREATININE 0.69  CALCIUM 9.0  MG --  PHOS --   Liver Function Tests:  Basename 02/05/11 0003  AST 30  ALT 23  ALKPHOS 101  BILITOT 0.5  PROT 5.9*  ALBUMIN 3.3*   No results found for this basename: LIPASE:2,AMYLASE:2 in the last 72 hours CBC:  Basename 02/07/11 0700 02/06/11 0545 02/05/11 0003  WBC 5.3 5.6 --  NEUTROABS -- -- 2.6  HGB 14.3 13.2 --  HCT 41.8 40.1 --  MCV 88.7 89.9 --  PLT 217 214 --   Cardiac Enzymes:  Basename 02/05/11 0556 02/05/11 0004  CKTOTAL 47 47  CKMB 2.2 2.8  CKMBINDEX -- --  TROPONINI <0.30 <0.30   Hemoglobin A1C:  Basename 02/05/11 0003  HGBA1C 5.8*   Fasting Lipid Panel:  Basename 02/05/11 0556  CHOL 132  HDL 71  LDLCALC 54  TRIG 33  CHOLHDL 1.9  LDLDIRECT --   Thyroid Function Tests:  Basename 02/05/11 0003  TSH 5.561*  T4TOTAL --  T3FREE --  THYROIDAB --    RADIOLOGY: Dg Chest Portable 1 View  02/05/2011  *RADIOLOGY REPORT*  Clinical Data: Chest pain  PORTABLE CHEST - 1 VIEW  Comparison: None.  Findings: The heart is markedly enlarged.  Pulmonary vascularity is within normal limits.  Thorax is rotated to the right. Mid  and upper lungs are grossly clear.  No pneumothorax or pleural effusion. Mild bibasilar atelectasis.  IMPRESSION: Cardiomegaly without edema.  Original Report Authenticated By: Donavan Burnet, M.D.    PHYSICAL EXAM General: Well developed, well nourished, in no acute distress Head: Eyes PERRLA, No xanthomas.   Normal cephalic and atramatic  Lungs: Clear bilaterally to auscultation and percussion. Heart: HRRR S1 S2, No MRG .  Pulses are 2+ & equal.            No carotid bruit. No JVD.  No abdominal bruits. No femoral bruits. Abdomen: Bowel sounds are positive, abdomen soft and non-tender without masses or                  Hernia's noted. Msk:  Back normal, normal gait. Normal strength and tone for age. Extremities: No clubbing, cyanosis or edema.  DP +1 Neuro: Alert and oriented X 3. Psych:  Good affect, responds appropriately  TELEMETRY: Reviewed telemetry pt VH:QIONGE fib with slow ventricular response. Frequent PVC's. 1 second pause.  ASSESSMENT AND PLAN: 1. Atrial Fib: Slow ventricular response with short pauses < 2.5 seconds.  Uncertain she is in need  of pacemaker at this time.  Noted that TSH is elevated at 5.561.Marland Kitchen She is not on amiodarone.  2. Hypertension: Blood pressure is not well controlled at present. Will increase her lisinopril to 20 mg daily.  Watch her response and check her BMET for evaluation of renal fx and potassium status with increased ACE dose.  3.  Elevated TSH: Will check T3 and T4 to evaluate for hypothyroidism contributing to symptoms and HR.   Noted that TSH is elevated at 5.561.  She has no history of hypothyroidism.  4.  Abnormal Echo: Elevated PAP at 77 mmHg with dilated right ventricular size.  D-Dimer has been ordered.   Bettey Mare. Lyman Bishop NP [facility]   Cardiology Attending Patient interviewed and examined. Discussed with Joni Reining, NP.  Above note annotated and modified based upon my findings.  Ms. Quraishi continues to complain of  atypical chest discomfort and and will ultimately require pharmacologic stress testing. Indication for pacing is borderline without critical bradycardia or prolonged pauses. It will be difficult to evaluate for whether or not she is symptomatic as a result for bradycardia, and pacing will probably ultimately be required. I favor proceeding with implant during this hospitalization, but will request an Electrophysiology Consult for assistance in this decision.  Atrial fibrillation with a slow ventricular response persists. Patient is at substantial risk for thromboembolism, but also at increased risk for complications related to chronic anticoagulation. Although she has lived independently until now, her family notes recent gait instability without falls. Heparin will be continued pending her decision whether treatment with warfarin is to be initiated.  Blood pressure control is suboptimal. We will adjust medications upwards.  Echocardiogram shows good left ventricular systolic function without significant valvular abnormalities with right ventricular enlargement and dysfunction associated with at least moderate pulmonary hypertension. The etiology of increased PA pressures is not apparent. A d-dimer level and arterial blood gas will be obtained. She does not have the body habitus to suggest sleep apnea or Pickwickian Syndrome. HIV and collagen vascular testing would not likely be productive.  She has PVCs on telemetry, frequent at times including runs of bigeminy. This may be a benign condition or could reflect ischemic heart disease.  [place] Bing, MD 02/07/2011, 2:12 PM

## 2011-02-08 ENCOUNTER — Encounter (HOSPITAL_COMMUNITY)
Admission: AD | Disposition: A | Discharge status: Home Health | Payer: Self-pay | Source: Other Acute Inpatient Hospital | Attending: Cardiology

## 2011-02-08 HISTORY — PX: PERMANENT PACEMAKER INSERTION: SHX5480

## 2011-02-08 LAB — BLOOD GAS, ARTERIAL
Acid-Base Excess: 4.4 mmol/L — ABNORMAL HIGH (ref 0.0–2.0)
Bicarbonate: 28.4 mEq/L — ABNORMAL HIGH (ref 20.0–24.0)
Drawn by: 24513
FIO2: 0.21 %
O2 Saturation: 93.6 %
TCO2: 29.8 mmol/L (ref 0–100)
pCO2 arterial: 43 mmHg (ref 35.0–45.0)
pO2, Arterial: 68.3 mmHg — ABNORMAL LOW (ref 80.0–100.0)

## 2011-02-08 LAB — CBC
Hemoglobin: 13.4 g/dL (ref 12.0–15.0)
MCH: 28.9 pg (ref 26.0–34.0)
MCHC: 32.8 g/dL (ref 30.0–36.0)
MCV: 88.1 fL (ref 78.0–100.0)
RBC: 4.63 MIL/uL (ref 3.87–5.11)

## 2011-02-08 LAB — GLUCOSE, CAPILLARY
Glucose-Capillary: 98 mg/dL (ref 70–99)
Glucose-Capillary: 82 mg/dL (ref 70–99)
Glucose-Capillary: 74 mg/dL (ref 70–99)

## 2011-02-08 SURGERY — PERMANENT PACEMAKER INSERTION
Anesthesia: Moderate Sedation

## 2011-02-08 MED ORDER — WARFARIN VIDEO
Freq: Once | Status: AC
  Administered 2011-02-08: 12:00:00
  Filled 2011-02-08: qty 1

## 2011-02-08 MED ORDER — SODIUM CHLORIDE 0.45 % IV SOLN
INTRAVENOUS | Status: DC
  Administered 2011-02-08: 19:00:00 via INTRAVENOUS

## 2011-02-08 MED ORDER — PATIENT'S GUIDE TO USING COUMADIN BOOK
Freq: Once | Status: AC
  Administered 2011-02-08: 21:00:00
  Filled 2011-02-08: qty 1

## 2011-02-08 MED ORDER — SODIUM CHLORIDE 0.9 % IR SOLN
Status: AC
  Administered 2011-02-08: 17:00:00
  Filled 2011-02-08: qty 2

## 2011-02-08 MED ORDER — SODIUM CHLORIDE 0.9 % IR SOLN: 1.0000 "application " | Status: DC

## 2011-02-08 MED ORDER — SODIUM CHLORIDE 0.45 % IV SOLN: INTRAVENOUS | Status: AC

## 2011-02-08 MED ORDER — ACETAMINOPHEN 325 MG PO TABS: 325.0000 mg | ORAL_TABLET | ORAL | Status: DC | PRN

## 2011-02-08 MED ORDER — MIDAZOLAM HCL 5 MG/5ML IJ SOLN
INTRAMUSCULAR | Status: AC
  Filled 2011-02-08: qty 5

## 2011-02-08 MED ORDER — HEPARIN (PORCINE) IN NACL 2-0.9 UNIT/ML-% IJ SOLN
INTRAMUSCULAR | Status: AC
  Filled 2011-02-08: qty 1000

## 2011-02-08 MED ORDER — FENTANYL CITRATE 0.05 MG/ML IJ SOLN
INTRAMUSCULAR | Status: AC
  Administered 2011-02-08: 25 ug via INTRAVENOUS
  Filled 2011-02-08: qty 2

## 2011-02-08 MED ORDER — FENTANYL CITRATE 0.05 MG/ML IJ SOLN
INTRAMUSCULAR | Status: AC
  Filled 2011-02-08: qty 2

## 2011-02-08 MED ORDER — WARFARIN SODIUM 2.5 MG PO TABS
2.5000 mg | ORAL_TABLET | Freq: Every day | ORAL | Status: DC
  Administered 2011-02-08: 2.5 mg via ORAL
  Filled 2011-02-08 (×2): qty 1

## 2011-02-08 MED ORDER — HYDROCODONE-ACETAMINOPHEN 5-325 MG PO TABS
1.0000 | ORAL_TABLET | Freq: Four times a day (QID) | ORAL | Status: DC | PRN
  Administered 2011-02-08 – 2011-02-10 (×3): 1 via ORAL
  Filled 2011-02-08: qty 2
  Filled 2011-02-08 (×2): qty 1

## 2011-02-08 MED ORDER — ACETAMINOPHEN 500 MG PO TABS
1000.0000 mg | ORAL_TABLET | Freq: Four times a day (QID) | ORAL | Status: AC
  Administered 2011-02-09: 1000 mg via ORAL
  Filled 2011-02-08 (×4): qty 2

## 2011-02-08 MED ORDER — LIDOCAINE HCL (PF) 1 % IJ SOLN
INTRAMUSCULAR | Status: AC
  Filled 2011-02-08: qty 60

## 2011-02-08 MED ORDER — CEFAZOLIN SODIUM 1-5 GM-% IV SOLN
INTRAVENOUS | Status: AC
  Administered 2011-02-09: 1 g via INTRAVENOUS
  Filled 2011-02-08: qty 50

## 2011-02-08 MED ORDER — SODIUM CHLORIDE 0.9 % IV SOLN: INTRAVENOUS | Status: DC

## 2011-02-08 MED ORDER — FENTANYL CITRATE 0.05 MG/ML IJ SOLN
25.0000 ug | INTRAMUSCULAR | Status: DC | PRN
  Administered 2011-02-08: 25 ug via INTRAVENOUS

## 2011-02-08 MED ORDER — ONDANSETRON HCL 4 MG/2ML IJ SOLN: 4.0000 mg | Freq: Four times a day (QID) | INTRAMUSCULAR | Status: DC | PRN

## 2011-02-08 MED ORDER — WARFARIN SODIUM 2.5 MG PO TABS
2.5000 mg | ORAL_TABLET | Freq: Every day | ORAL | Status: DC
  Filled 2011-02-08 (×2): qty 1

## 2011-02-08 NOTE — Progress Notes (Signed)
Subjective:  No complaints  Objective:  Vital Signs in the last 24 hours: Temp:  [98.7 F (37.1 C)] 98.7 F (37.1 C) (11/11 2200) Pulse Rate:  [42-52] 42  (11/12 0557) Resp:  [14] 14  (11/12 0557) BP: (176-204)/(66-85) 199/74 mmHg (11/12 0557) SpO2:  [95 %] 95 % (11/12 0557)  Intake/Output from previous day: 11/11 0701 - 11/12 0700 In: 771.6 [P.O.:360; I.V.:411.6] Out: 950 [Urine:950] Intake/Output from this shift:    Physical Exam: Well appearing elderly woman NAD HEENT: Unremarkable Neck:  No JVD, no thyromegally Lymphatics:  No adenopathy Back:  No CVA tenderness Lungs:  Clear HEART:  IRegular rate rhythm, no murmurs, no rubs, no clicks Abd:  Flat, positive bowel sounds, no organomegally, no rebound, no guarding. Small bandage over RL quadrant Ext:  2 plus pulses, no edema, no cyanosis, no clubbing Skin:  No rashes no nodules Neuro:  CN II through XII intact, motor grossly intact  Lab Results:  Basename 02/08/11 0705 02/07/11 0700  WBC 5.1 5.3  HGB 13.4 14.3  PLT 231 217   No results found for this basename: NA:2,K:2,CL:2,CO2:2,GLUCOSE:2,BUN:2,CREATININE:2 in the last 72 hours No results found for this basename: TROPONINI:2,CK,MB:2 in the last 72 hours Hepatic Function Panel No results found for this basename: PROT,ALBUMIN,AST,ALT,ALKPHOS,BILITOT,BILIDIR,IBILI in the last 72 hours No results found for this basename: CHOL in the last 72 hours No results found for this basename: PROTIME in the last 72 hours  I  Cardiac Studies: Tele demonstrates atrial fib with a slow/controlled VR  Assessment/Plan:  Principal Problem:  *Bradycardia - I have reviewed records and have recommended PPM. The risk/benefit/goal/expectation of the procedure have been discussed with the patient and her family and they wish to proceed. Active Problems:  Atrial fibrillation  PVC (premature ventricular contraction)  Chest pain  Hypertension    LOS: 4 days    [name] 02/08/2011, 12:50 PM

## 2011-02-08 NOTE — Interval H&P Note (Signed)
History and Physical Interval Note:   02/08/2011   4:43 PM   [name]  has presented today for surgery, with the diagnosis of bradycardia  The various methods of treatment have been discussed with the patient and family. After consideration of risks, benefits and other options for treatment, the patient has consented to  Procedure(s): PERMANENT PACEMAKER INSERTION as a surgical intervention .  The patients' history has been reviewed, patient examined, no change in status, stable for surgery.  I have reviewed the patients' chart and labs.  Questions were answered to the patient's satisfaction.     Lewayne Bunting  MD

## 2011-02-08 NOTE — Progress Notes (Signed)
  TELEMETRY: Reviewed telemetry pt in atrial fibrillation with a slow ventricular response. HR 45-55.: Filed Vitals:   02/07/11 1338 02/07/11 1900 02/07/11 2200 02/08/11 0557  BP: 204/85 176/66 178/72 199/74  Pulse:  52 42 42  Temp:   98.7 F (37.1 C)   TempSrc:   Oral   Resp:   14 14  Height:      Weight:      SpO2:   95% 95%    Intake/Output Summary (Last 24 hours) at 02/08/11 0842 Last data filed at 02/08/11 0659  Gross per 24 hour  Intake 771.57 ml  Output    950 ml  Net -178.43 ml    SUBJECTIVE Denies any further chest pain or dyspnea. Wobbly on her feet. No history of falls. No history of CVA   LABS: Basic Metabolic Panel: No results found for this basename: NA:2,K:2,CL:2,CO2:2,GLUCOSE:2,BUN:2,CREATININE:2,CALCIUM:2,MG:2,PHOS:2 in the last 72 hours Liver Function Tests: No results found for this basename: AST:2,ALT:2,ALKPHOS:2,BILITOT:2,PROT:2,ALBUMIN:2 in the last 72 hours No results found for this basename: LIPASE:2,AMYLASE:2 in the last 72 hours CBC:  Basename 02/08/11 0705 02/07/11 0700  WBC 5.1 5.3  NEUTROABS -- --  HGB 13.4 14.3  HCT 40.8 41.8  MCV 88.1 88.7  PLT 231 217   Cardiac Enzymes: No results found for this basename: CKTOTAL:3,CKMB:3,CKMBINDEX:3,TROPONINI:3 in the last 72 hours BNP: No results found for this basename: POCBNP:3 in the last 72 hours D-Dimer:  Basename 02/07/11 1505  DDIMER 0.36   Hemoglobin A1C: No results found for this basename: HGBA1C in the last 72 hours Fasting Lipid Panel: No results found for this basename: CHOL,HDL,LDLCALC,TRIG,CHOLHDL,LDLDIRECT in the last 72 hours Thyroid Function Tests: No results found for this basename: TSH,T4TOTAL,FREET3,T3FREE,THYROIDAB in the last 72 hours Anemia Panel: No results found for this basename: VITAMINB12,FOLATE,FERRITIN,TIBC,IRON,RETICCTPCT in the last 72 hours  Radiology/Studies:  Dg Chest Portable 1 View  02/05/2011  *RADIOLOGY REPORT*  Clinical Data: Chest pain  PORTABLE  CHEST - 1 VIEW  Comparison: None.  Findings: The heart is markedly enlarged.  Pulmonary vascularity is within normal limits.  Thorax is rotated to the right. Mid and upper lungs are grossly clear.  No pneumothorax or pleural effusion. Mild bibasilar atelectasis.  IMPRESSION: Cardiomegaly without edema.  Original Report Authenticated By: Donavan Burnet, M.D.    PHYSICAL EXAM General: Elderly,Well developed, well nourished, in no acute distress. Head: Normocephalic, atraumatic, sclera non-icteric, no xanthomas, nares are without discharge. Neck: Negative for carotid bruits. JVD not elevated. Lungs: Clear bilaterally to auscultation without wheezes, rales, or rhonchi. Breathing is unlabored. Heart: RRR S1 S2 without murmurs, rubs, or gallops.  Abdomen: Soft, non-tender, non-distended with normoactive bowel sounds. No hepatomegaly. No rebound/guarding. No obvious abdominal masses. Msk:  Strength and tone appears normal for age. Extremities: No clubbing, cyanosis or edema.  Distal pedal pulses are 2+ and equal bilaterally. Neuro: Alert and oriented X 3. Moves all extremities spontaneously. Psych:  Responds to questions appropriately with a normal affect.  ASSESSMENT AND PLAN:  Principal Problem:  *Bradycardia Active Problems:  Atrial fibrillation  PVC (premature ventricular contraction)  Chest pain  Hypertension   Plan: No further chest pain. Ruled out for mi. Had normal cardiac cath 10 years ago. Still bradycardic with Afib. Not clear if she is symptomatic. Dr. Dietrich Pates felt pacemaker indicated. No clear etiology for pulmonary HTN. D-dimer is negative. Tsh is mildly elevated with normal T4 will observe for now.  Will ask EP to see today.  Signed, Ellesse Antenucci [place] MD

## 2011-02-08 NOTE — Progress Notes (Signed)
ANTICOAGULATION CONSULT NOTE - Follow Up Consult  Pharmacy Consult for Heparin Indication: chest pain, ACS  No Known Allergies  Patient Measurements: Height: 5\' 6"  (167.6 cm) Weight: 138 lb 10.7 oz (62.9 kg) IBW/kg (Calculated) : 59.3    Vital Signs: Temp: 98.7 F (37.1 C) (11/11 2200) Temp src: Oral (11/11 2200) BP: 199/74 mmHg (11/12 0557) Pulse Rate: 42  (11/12 0557)  Labs:  Basename 02/08/11 0705 02/07/11 0700 02/06/11 0545  HGB 13.4 14.3 --  HCT 40.8 41.8 40.1  PLT 231 217 214  APTT -- -- --  LABPROT -- -- --  INR -- -- --  HEPARINUNFRC 0.22* 0.20* 0.22*  CREATININE -- -- --  CKTOTAL -- -- --  CKMB -- -- --  TROPONINI -- -- --   Estimated Creatinine Clearance: 42.9 ml/min (by C-G formula based on Cr of 0.69).   Medications:  Scheduled:    . antiseptic oral rinse  15 mL Mouth Rinse BID  . calcium carbonate  0.5 tablet Oral Daily  . donepezil  5 mg Oral QHS  . hydrochlorothiazide  25 mg Oral Daily  . insulin aspart  0-9 Units Subcutaneous TID WC  . lisinopril  10 mg Oral Daily  . loratadine  10 mg Oral Daily  . pantoprazole  40 mg Oral Daily  . sodium chloride  3 mL Intravenous Q12H    Assessment: 77yo female with chest pain, ACS.  SHe has had no further chest pain, to be evaluated for pacer placement.  No further issues with bleeding.  Hep level is low on current rate of 950 units/hr, pump infusing appropriately.  Will increase rate and f/u HL this afternoon.  Goal of Therapy:  Heparin level 0.3-0.7 units/ml1\   Plan:  1.  Increase heparin to 1050 units/hr 2.  Check heparin level in 8hr   Truth Wolaver P 02/08/2011,9:34 AM

## 2011-02-08 NOTE — H&P (View-Only) (Signed)
Subjective:  No complaints  Objective:  Vital Signs in the last 24 hours: Temp:  [98.7 F (37.1 C)] 98.7 F (37.1 C) (11/11 2200) Pulse Rate:  [42-52] 42  (11/12 0557) Resp:  [14] 14  (11/12 0557) BP: (176-204)/(66-85) 199/74 mmHg (11/12 0557) SpO2:  [95 %] 95 % (11/12 0557)  Intake/Output from previous day: 11/11 0701 - 11/12 0700 In: 771.6 [P.O.:360; I.V.:411.6] Out: 950 [Urine:950] Intake/Output from this shift:    Physical Exam: Well appearing elderly woman NAD HEENT: Unremarkable Neck:  No JVD, no thyromegally Lymphatics:  No adenopathy Back:  No CVA tenderness Lungs:  Clear HEART:  IRegular rate rhythm, no murmurs, no rubs, no clicks Abd:  Flat, positive bowel sounds, no organomegally, no rebound, no guarding. Small bandage over RL quadrant Ext:  2 plus pulses, no edema, no cyanosis, no clubbing Skin:  No rashes no nodules Neuro:  CN II through XII intact, motor grossly intact  Lab Results:  Basename 02/08/11 0705 02/07/11 0700  WBC 5.1 5.3  HGB 13.4 14.3  PLT 231 217   No results found for this basename: NA:2,K:2,CL:2,CO2:2,GLUCOSE:2,BUN:2,CREATININE:2 in the last 72 hours No results found for this basename: TROPONINI:2,CK,MB:2 in the last 72 hours Hepatic Function Panel No results found for this basename: PROT,ALBUMIN,AST,ALT,ALKPHOS,BILITOT,BILIDIR,IBILI in the last 72 hours No results found for this basename: CHOL in the last 72 hours No results found for this basename: PROTIME in the last 72 hours  I  Cardiac Studies: Tele demonstrates atrial fib with a slow/controlled VR  Assessment/Plan:  Principal Problem:  *Bradycardia - I have reviewed records and have recommended PPM. The risk/benefit/goal/expectation of the procedure have been discussed with the patient and her family and they wish to proceed. Active Problems:  Atrial fibrillation  PVC (premature ventricular contraction)  Chest pain  Hypertension    LOS: 4 days    Gregg  Taylor 02/08/2011, 12:50 PM    

## 2011-02-08 NOTE — Op Note (Signed)
[name], [name]                  ACCOUNT NO.:  [id number]  MEDICAL RECORD NO.:  [id number]  LOCATION:  3714                         FACILITY:  [facility]  PHYSICIAN:  Doylene Canning. Ladona Ridgel, MD    DATE OF BIRTH:  April 25, 1917  DATE OF PROCEDURE:  02/08/2011 DATE OF DISCHARGE:                              OPERATIVE REPORT   PROCEDURE PERFORMED:  Insertion of a single-chamber pacemaker.  INDICATION:  Symptomatic bradycardia in the setting of atrial fibrillation.  INTRODUCTION:  The patient is a 77 year old woman who was admitted to the hospital with symptomatic atrial fibrillation and was found to have tachy-brady syndrome.  She is now referred for permanent pacemaker insertion.  PROCEDURE IN DETAIL:  After informed was obtained, the patient was taken to the diagnostic EP lab in a fasting state.  After usual preparation and draping, intravenous fentanyl and midazolam was given for sedation. 30 mL of lidocaine was infiltrated into the deltopectoral groove region. A 5-cm incision was carried out over this region.  Electrocautery was dissected down to the fascia plane.  A blunt dissection was then carried out in an attempt to isolate the cephalic vein, which was quite deep. The patient was quite dry and initially the cephalic vein could not be visualized.  At this point, attention was then turned to try and puncture the subclavian vein.  Multiple attempts were carried out unsuccessfully.  Venography of the vein was then carried out which demonstrated that the vein was displaced very caudally down around the 4th rib.  Because of low location and the concern about pneumothorax, ultimately attempts to puncture of the subclavian vein were abandoned and the cephalic vein was re-examined.  To help with low venous pressure, a 200 mL of normal saline fluid bolus was given.  Having done this, additional dissection in the deltopectoral groove demonstrated a cephalic vein that was isolated.  The [employer]  Dextrus model 4136, active fixation bipolar pacing lead, serial number [id number] was advanced to the left cephalic vein in the usual manner and on into the right ventricle. The venous system was quite tortuous.  Eventually, the lead was advanced into the right ventricle.  Mapping was carried out demonstrating R-waves typically between 5 and 8 mV.  There was extensive interventional tricuspid regurgitation which resulted in active fixation of the lead and followed with ejection of the lead after being actively back into the right atrium.  At this point, additional mapping was carried out and RV lead was placed in the RV apex and the helix was advanced quite vigorously, but apparently in a satisfactory place.  The stylet was removed and the lead remained in place.  At this point, the R-wave is measured 6 mV, the impedance was 700 ohms, and threshold was a volt of 0.5 msec.  With these satisfactory parameters, the lead was secured to the subpectoral fascia with a figure-of-eight silk suture.  The pacemaker pocket was then made without difficulty and electrocautery was utilized to assure hemostasis.  Gentamicin irrigation was then utilized to irrigate the pocket.  The [facility] Scientific Advantio single-chamber pacemaker, serial number [id number] was connected to the pacing lead and placed back in the subcutaneous pocket  and were secured with silk suture.  The pocket was again irrigated with antibiotic solution and the incision closed with 2-0 and 3-0 Vicryl.  Benzoin and Steri-Strips were painted on the skin, a pressure dressing was applied, and the patient was returned to her room in satisfactory condition.  COMPLICATIONS:  There are no immediate procedure complications.  RESULTS:  This demonstrate successful insertion of a [employer] single-chamber pacemaker by way of the left cephalic vein in a patient with chronic atrial fibrillation and symptomatic bradycardia.     Doylene Canning.  Ladona Ridgel, MD     GWT/MEDQ  D:  02/08/2011  T:  02/08/2011  Job:  [id number]  cc:   Selinda Flavin, MD

## 2011-02-08 NOTE — Procedures (Signed)
PPM placed via left cephalic vein without immediate complication. Full not dictated. #[id number].

## 2011-02-09 ENCOUNTER — Encounter (HOSPITAL_COMMUNITY): Payer: Self-pay

## 2011-02-09 ENCOUNTER — Other Ambulatory Visit: Payer: Self-pay

## 2011-02-09 ENCOUNTER — Inpatient Hospital Stay (HOSPITAL_COMMUNITY): Payer: Medicare Other

## 2011-02-09 LAB — CBC
MCV: 87.7 fL (ref 78.0–100.0)
Platelets: 248 10*3/uL (ref 150–400)
RBC: 4.79 MIL/uL (ref 3.87–5.11)
RDW: 15.1 % (ref 11.5–15.5)
WBC: 9.4 10*3/uL (ref 4.0–10.5)

## 2011-02-09 LAB — GLUCOSE, CAPILLARY
Glucose-Capillary: 129 mg/dL — ABNORMAL HIGH (ref 70–99)
Glucose-Capillary: 139 mg/dL — ABNORMAL HIGH (ref 70–99)
Glucose-Capillary: 100 mg/dL — ABNORMAL HIGH (ref 70–99)
Glucose-Capillary: 149 mg/dL — ABNORMAL HIGH (ref 70–99)

## 2011-02-09 MED ORDER — CEFAZOLIN SODIUM 1-5 GM-% IV SOLN
1.0000 g | Freq: Once | INTRAVENOUS | Status: AC
  Administered 2011-02-09: 1 g via INTRAVENOUS
  Filled 2011-02-09: qty 50

## 2011-02-09 MED ORDER — WARFARIN SODIUM 2.5 MG PO TABS
2.5000 mg | ORAL_TABLET | Freq: Every day | ORAL | Status: DC
  Administered 2011-02-09: 2.5 mg via ORAL
  Filled 2011-02-09 (×2): qty 1

## 2011-02-09 NOTE — Progress Notes (Addendum)
  Subjective:  C/o pain at [facility] insertion site.  Objective:  Vital Signs in the last 24 hours: Temp:  [97.3 F (36.3 C)-97.9 F (36.6 C)] 97.9 F (36.6 C) (11/13 0641) Pulse Rate:  [43-143] 59  (11/13 0641) Resp:  [14-20] 14  (11/13 0641) BP: (132-194)/(53-96) 171/74 mmHg (11/13 0641) SpO2:  [94 %] 94 % (11/13 0641)  Intake/Output from previous day: 11/12 0701 - 11/13 0700 In: 510 [P.O.:360; I.V.:150] Out: 101 [Urine:100; Stool:1] Intake/Output from this shift:    Physical Exam: Elderly but Well appearing NAD HEENT: Unremarkable Neck:  No JVD, no thyromegally Lymphatics:  No adenopathy Back:  No CVA tenderness Lungs:  Clear. Well healed PPM incision. HEART:  Regular rate rhythm, no murmurs, no rubs, no clicks Abd:  Flat, positive bowel sounds, no organomegally, no rebound, no guarding Ext:  2 plus pulses, no edema, no cyanosis, no clubbing Skin:  No rashes no nodules Neuro:  CN II through XII intact, motor grossly intact  Lab Results:  Basename 02/09/11 0500 02/08/11 0705  WBC 9.4 5.1  HGB 14.7 13.4  PLT 248 231   No results found for this basename: NA:2,K:2,CL:2,CO2:2,GLUCOSE:2,BUN:2,CREATININE:2 in the last 72 hours No results found for this basename: TROPONINI:2,CK,MB:2 in the last 72 hours Hepatic Function Panel No results found for this basename: PROT,ALBUMIN,AST,ALT,ALKPHOS,BILITOT,BILIDIR,IBILI in the last 72 hours No results found for this basename: CHOL in the last 72 hours No results found for this basename: PROTIME in the last 72 hours  Imaging: Chest xray demonstrates no PTX and satisfactory RV lead position.  Cardiac Studies: Tele - Atrial fib with intermittant Ventricular pacing and appropriate sensing Assessment/Plan:  Principal Problem:  *Bradycardia - She is s/p PPM and is doing well. Her ventricular rate is well controlled. Will give 2 days of pain medication (Tylenol 3). Active Problems:  Atrial fibrillation - She has a significant  thromboembolic risk and I have started her on coumadin. She will return to our [place] office in 3 days to check PT/INR. She will discharged on coumadin 2 mg daily after receiving 2.5 mg twice in the hospital.  Debilitation - we will obtain a consult from Physical therapy prior to discharge. I doubt she would benefit from 1-2 weeks of therapy at a SNF but would like to have PT input.     LOS: 5 days    Lewayne Bunting 02/09/2011, 9:36 AM

## 2011-02-09 NOTE — Plan of Care (Signed)
Problem: Phase III Progression Outcomes Goal: Ambulating in room or hall Outcome: Progressing With assist

## 2011-02-09 NOTE — Progress Notes (Signed)
Physical Therapy Evaluation Patient Details Name: [name] MRN: [id number] DOB: 1918/02/16 Today's Date: 02/09/2011  Problem List:  Patient Active Problem List  Diagnoses  . Bradycardia  . Atrial fibrillation  . PVC (premature ventricular contraction)  . Chest pain  . Hypertension    Past Medical History:  Past Medical History  Diagnosis Date  . Diabetes mellitus   . Pneumonia   . Atrial fibrillation     not on coumadin as outpatient  . Hypertension   . Chest pain 2001    nonobstructive dz by LHC in 2001   Past Surgical History:  Past Surgical History  Procedure Date  . Abdominal hysterectomy   . Breast surgery     60 yrs ago after childbirth breast lanced  . Eye surgery     Cataract Sx     PT Assessment/Plan/Recommendation PT Assessment Clinical Impression Statement: Pt. with decr mobility due to decr balance and pain after pacer implant.  Recommend pt. stay with sister until pt. able to manage well enough to live alone.   PT Recommendation/Assessment: Patient will need skilled PT in the acute care venue PT Problem List: Decreased strength;Decreased activity tolerance;Decreased balance;Decreased mobility;Pain Barriers to Discharge: None PT Therapy Diagnosis : Difficulty walking;Generalized weakness;Acute pain PT Plan PT Frequency: Min 3X/week PT Treatment/Interventions: DME instruction;Gait training;Stair training;Functional mobility training;Balance training;Therapeutic exercise;Patient/family education PT Recommendation Follow Up Recommendations: Home health PT PT Goals  Acute Rehab PT Goals PT Goal Formulation: With patient Time For Goal Achievement: 7 days Pt will go Supine/Side to Sit: with supervision PT Goal: Supine/Side to Sit - Progress: Not met Pt will go Sit to Supine/Side: with supervision PT Goal: Sit to Supine/Side - Progress: Not met Pt will Transfer Sit to Stand/Stand to Sit: with supervision PT Transfer Goal: Sit to Stand/Stand to Sit -  Progress: Not met Pt will Transfer Bed to Chair/Chair to Bed: with supervision PT Transfer Goal: Bed to Chair/Chair to Bed - Progress: Not met Pt will Ambulate: 51 - 150 feet;with supervision;with least restrictive assistive device PT Goal: Ambulate - Progress: Not met Pt will Go Up / Down Stairs: 1-2 stairs;with min assist;with rail(s) PT Goal: Up/Down Stairs - Progress: Not met  PT Evaluation Precautions/Restrictions  Precautions Precautions: Fall Required Braces or Orthoses: Yes Other Brace/Splint: Pt. with sling on LUE.  Family reports MD wants sling on all the time. Restrictions Weight Bearing Restrictions: No Prior Functioning  Home Living Lives With: Other (Comment) (Pt. will go to sister's home after dc) Type of Home: House Home Layout: One level Home Access: Stairs to enter (Sister's house has stairs to enter) [employer] of Steps: 2-3 Prior Function Level of Independence: Independent with basic ADLs Driving: Yes Vocation: Retired Financial risk analyst Arousal/Alertness: Awake/alert Overall Cognitive Status: Appears within functional limits for tasks assessed   Extremity Assessment RLE Overall Strength Comments: Grossly 4/5 LLE Overall Strength Comments: Grossly 4/5 Mobility (including Balance) Transfers Sit to Stand: 4: Min assist;From chair/3-in-1;With upper extremity assist;With armrests Sit to Stand Details (indicate cue type and reason): Verbal cues to scoot forward prior to stand and to push up with RUE Stand to Sit: 5: Supervision;With upper extremity assist;To bed;With armrests  Ambulation/Gait Ambulation/Gait Assistance: 4: Min assist Ambulation/Gait Assistance Details (indicate cue type and reason): Left LUE in sling during gait. Ambulation Distance (Feet): 125 Feet Assistive device: 1 person hand held assist Gait Pattern: Decreased step length - right;Decreased step length - left;Decreased trunk rotation  Balance Balance Assessed:  Yes Static Standing Balance Static  Standing - Level of Assistance: 5: Stand by assistance End of Session PT - End of Session Equipment Utilized During Treatment: Other (comment) (LUE sling) Activity Tolerance: Patient limited by fatigue Patient left: in chair;with call bell in reach;with family/visitor present Nurse Communication: Mobility status for ambulation;Mobility status for transfers General Behavior During Session: [facility] for tasks performed Cognition: [facility] for tasks performed  [facility] 02/09/2011, 11:34 AM [facility] PT [phone]

## 2011-02-09 NOTE — Progress Notes (Signed)
In to check on patient.  Patient is awake and alert, and diaphoretic.  States that she "just got hot."  Three extra blankets removed, CBG obtained and oral temp checked.  CBG=129.  Temp 98.2.  BP 159/53.  V-paced on monitor with HR 60.  Denies pain other than "gas pains" in her abdomen.  Pacemaker site assessed, dressing C/D/I with no s/s of bleeding or hematoma.  Bath given and linen changed.  Will continue to monitor.  Alonza Bogus

## 2011-02-10 LAB — CBC
Hemoglobin: 14.3 g/dL (ref 12.0–15.0)
Platelets: 221 10*3/uL (ref 150–400)
RBC: 4.71 MIL/uL (ref 3.87–5.11)
WBC: 8.4 10*3/uL (ref 4.0–10.5)

## 2011-02-10 LAB — GLUCOSE, CAPILLARY
Glucose-Capillary: 112 mg/dL — ABNORMAL HIGH (ref 70–99)
Glucose-Capillary: 103 mg/dL — ABNORMAL HIGH (ref 70–99)

## 2011-02-10 LAB — PROTIME-INR
INR: 1.08 (ref 0.00–1.49)
Prothrombin Time: 14.2 seconds (ref 11.6–15.2)

## 2011-02-10 MED ORDER — HYDROCHLOROTHIAZIDE 25 MG PO TABS: 25.0000 mg | ORAL_TABLET | Freq: Every day | ORAL | Status: DC

## 2011-02-10 MED ORDER — ASPIRIN EC 81 MG PO TBEC: 81.0000 mg | DELAYED_RELEASE_TABLET | Freq: Every day | ORAL | Status: AC

## 2011-02-10 MED ORDER — WARFARIN SODIUM 2.5 MG PO TABS: 2.5000 mg | ORAL_TABLET | Freq: Every day | ORAL | Status: DC

## 2011-02-10 MED ORDER — NITROGLYCERIN 0.4 MG SL SUBL: 0.4000 mg | SUBLINGUAL_TABLET | SUBLINGUAL | Status: DC | PRN

## 2011-02-10 MED ORDER — LISINOPRIL 10 MG PO TABS: 10.0000 mg | ORAL_TABLET | Freq: Every day | ORAL | Status: DC

## 2011-02-10 NOTE — Progress Notes (Addendum)
Noted d/c home orders so I checked with pt to make sure she didn't have any concerns with going home. Offered a treatment this afternoon to practice stairs but pt refusing at this time. Ivonne Andrew, PT, DPT [phone]  While standing outside the room pt's daughter stated concerns about pt's balance and using a RW at home so I agreed to work with pt for possible need of RW as it was not ordered with d/c orders. Pt presents sitting in recliner without sling on arm stating the doctor removed it this morning. Reports 0/10 pain. Stood from Medical illustrator with minA with tactile/manual facilitation for follow through to stand and steady self once standing. Pt then took 5-6 very shuffled and slow steps with minA to maintain stability. Pt able to stand for 30 seconds with stand by assist while I assembled the RW in front of her. She then proceeded to ambulate approx 180 ft with RW and mingaurdA. Pt amb with flexed trunk, short and shuffled steps, decreased gait speed and very small BOS. No LOB noted and pt reports feeling better with RW to assist her. Pt amb back to recliner and sat minA with v/c's and t/c's to sequence safe descent to chair. Pt left in recliner with legs propped up, call bell within reach and daughter present in room. Spoke with nursing and relayed recommendations for RW and HHPT/OT for continued therapies as well as need for 24 hour assist at home given her instability.   Ivonne Andrew, PT, DPT [phone]

## 2011-02-10 NOTE — Discharge Summary (Signed)
Patient ID: SKIE VITRANO,  MRN: [id number], DOB/AGE: [birth year] 77 y.o.  Admit date: 02/04/2011 Discharge date: 02/10/2011  Primary Care Provider: Selinda Flavin Primary Cardiologist: [facility]  Discharge Diagnoses Principal Problem:  *Bradycardia Active Problems:  Atrial fibrillation  PVC (premature ventricular contraction)  Chest pain  Hypertension   Allergies No Known Allergies  Procedures 2D ECHO - 02/05/2011 Study Conclusions  - Left ventricle: The cavity size was normal. Wall thickness was normal. Systolic function was vigorous. The estimated ejection fraction was in the range of 65% to 70%. Wall motion was normal; there were no regional wall motion abnormalities. The study is not technically sufficient to allow evaluation of LV diastolic function. - Mitral valve: Calcified annulus. Mildly thickened leaflets . Mild regurgitation. - Left atrium: The atrium was mildly to moderately dilated. - Right ventricle: The cavity size was severely dilated. Systolic function was mildly to moderately reduced. - Right atrium: The atrium was severely dilated. - Tricuspid valve: Severe regurgitation. - Pulmonic valve: Moderate regurgitation. - Pulmonary arteries: PA peak pressure: 77mm Hg (S). ________________________________________________________  02/08/2011 - PPM INSERTION  [employer] Advantio single chamber ppm. _________________________________________________________  02/09/2011 - CXR  IMPRESSION: Interval placement of single lead transvenous pacemaker with controller device on the left. No pneumothorax evident. Enlargement of the cardiac silhouette which appears slightly smaller than on previous study. Overall hyperinflation configuration. Mild upper lobe vessel prominence. Minimal right basilar atelectasis. Minimal right costophrenic angle blunting may reflect trace amount of right pleural  effusion. __________________________________________________________   History of Present Illness 77 y/o female with the above problem list who presented to [facility] on [facility]/8 with complaints of palpitations, chest pressure, wkns, and occas lightheadedness.  There, she was found to be bradycardic - in a.fib - with rates in the 40's.  Decision was made to transfer to [facility] for further eval.  Hospital Course  Pt r/o for MI.  Her home Diltiazem dose was held and she cont to have bradycardia though no further palpitations.  She did have intermittent chest discomfort but in light of her NL enzymes and NL LV fxn on echo, along with h/o nonobstructive cad on cath in the past, decision was made not to pursue additional ischemic eval.  Because of bradycardia, pt was seen by electrophysiology and it was felt that given her tachy-brady syndrome, she would benefit from PPM placement.  Further, with a CHADS2 score of 5, it was felt that she should be fully anticoagulated.  She was placed on Heparin.  She underwent successful PPM placement on 11/12 and was started on Coumadin.  She was seen by Physical Therapy on 11/13 w/ recommendation to stay with family until able to care for herself once more (lives alone).  Pt will be d/c'd today in good condition.  Discharge Vitals:  Blood pressure 149/77, pulse 60, temperature 96.9 F (36.1 C), temperature source Oral, resp. rate 18, height 5\' 6"  (1.676 m), weight 138 lb 10.7 oz (62.9 kg), SpO2 95.00%.   Labs: Results for orders placed during the hospital encounter of 02/04/11 (from the past 72 hour(s))  D-DIMER, QUANTITATIVE     Status: Normal   Collection Time   02/07/11  3:05 PM      Component Value Range Comment   D-Dimer, Quant 0.36  0.00 - 0.48 (ug/mL-FEU)   T4, FREE     Status: Normal   Collection Time   02/07/11  3:05 PM      Component Value Range Comment   Free T4 1.36  0.80 - 1.80 (ng/dL)   GLUCOSE, CAPILLARY     Status: Abnormal    Collection Time   02/07/11  5:18 PM      Component Value Range Comment   Glucose-Capillary 110 (*) 70 - 99 (mg/dL)   GLUCOSE, CAPILLARY     Status: Abnormal   Collection Time   02/07/11  9:14 PM      Component Value Range Comment   Glucose-Capillary 118 (*) 70 - 99 (mg/dL)   BLOOD GAS, ARTERIAL     Status: Abnormal   Collection Time   02/08/11  5:42 AM      Component Value Range Comment   FIO2 .21      Delivery systems ROOM AIR      pH, Arterial 7.436 (*) 7.350 - 7.400     pCO2 arterial 43.0  35.0 - 45.0 (mmHg)    pO2, Arterial 68.3 (*) 80.0 - 100.0 (mmHg)    Bicarbonate 28.4 (*) 20.0 - 24.0 (mEq/L)    TCO2 29.8  0 - 100 (mmol/L)    Acid-Base Excess 4.4 (*) 0.0 - 2.0 (mmol/L)    O2 Saturation 93.6      Patient temperature 98.6      Collection site LEFT RADIAL      Drawn by [phone]      Sample type ARTERIAL      Allens test (pass/fail) PASS  PASS    HEPARIN LEVEL     Status: Abnormal   Collection Time   02/08/11  7:05 AM      Component Value Range Comment   Heparin Unfractionated 0.22 (*) 0.30 - 0.70 (IU/mL)   CBC     Status: Normal   Collection Time   02/08/11  7:05 AM      Component Value Range Comment   WBC 5.1  4.0 - 10.5 (K/uL)    RBC 4.63  3.87 - 5.11 (MIL/uL)    Hemoglobin 13.4  12.0 - 15.0 (g/dL)    HCT 40.9  81.1 - 91.4 (%)    MCV 88.1  78.0 - 100.0 (fL)    MCH 28.9  26.0 - 34.0 (pg)    MCHC 32.8  30.0 - 36.0 (g/dL)    RDW 78.2  95.6 - 21.3 (%)    Platelets 231  150 - 400 (K/uL)   GLUCOSE, CAPILLARY     Status: Normal   Collection Time   02/08/11  7:43 AM      Component Value Range Comment   Glucose-Capillary 98  70 - 99 (mg/dL)   GLUCOSE, CAPILLARY     Status: Normal   Collection Time   02/08/11 11:28 AM      Component Value Range Comment   Glucose-Capillary 82  70 - 99 (mg/dL)   GLUCOSE, CAPILLARY     Status: Normal   Collection Time   02/08/11  6:30 PM      Component Value Range Comment   Glucose-Capillary 74  70 - 99 (mg/dL)   GLUCOSE, CAPILLARY      Status: Abnormal   Collection Time   02/08/11  9:17 PM      Component Value Range Comment   Glucose-Capillary 193 (*) 70 - 99 (mg/dL)    Comment 1 Notify RN     GLUCOSE, CAPILLARY     Status: Abnormal   Collection Time   02/09/11  1:54 AM      Component Value Range Comment   Glucose-Capillary 129 (*) 70 - 99 (mg/dL)   CBC  Status: Normal   Collection Time   02/09/11  5:00 AM      Component Value Range Comment   WBC 9.4  4.0 - 10.5 (K/uL)    RBC 4.79  3.87 - 5.11 (MIL/uL)    Hemoglobin 14.7  12.0 - 15.0 (g/dL)    HCT 40.9  81.1 - 91.4 (%)    MCV 87.7  78.0 - 100.0 (fL)    MCH 30.7  26.0 - 34.0 (pg)    MCHC 35.0  30.0 - 36.0 (g/dL)    RDW 78.2  95.6 - 21.3 (%)    Platelets 248  150 - 400 (K/uL)   PROTIME-INR     Status: Normal   Collection Time   02/09/11  5:00 AM      Component Value Range Comment   Prothrombin Time 13.9  11.6 - 15.2 (seconds)    INR 1.05  0.00 - 1.49    GLUCOSE, CAPILLARY     Status: Abnormal   Collection Time   02/09/11  7:23 AM      Component Value Range Comment   Glucose-Capillary 116 (*) 70 - 99 (mg/dL)   GLUCOSE, CAPILLARY     Status: Abnormal   Collection Time   02/09/11 11:21 AM      Component Value Range Comment   Glucose-Capillary 139 (*) 70 - 99 (mg/dL)   GLUCOSE, CAPILLARY     Status: Abnormal   Collection Time   02/09/11  4:32 PM      Component Value Range Comment   Glucose-Capillary 100 (*) 70 - 99 (mg/dL)   GLUCOSE, CAPILLARY     Status: Abnormal   Collection Time   02/09/11  8:32 PM      Component Value Range Comment   Glucose-Capillary 149 (*) 70 - 99 (mg/dL)   CBC     Status: Normal   Collection Time   02/10/11  6:35 AM      Component Value Range Comment   WBC 8.4  4.0 - 10.5 (K/uL)    RBC 4.71  3.87 - 5.11 (MIL/uL)    Hemoglobin 14.3  12.0 - 15.0 (g/dL)    HCT 08.6  57.8 - 46.9 (%)    MCV 87.5  78.0 - 100.0 (fL)    MCH 30.4  26.0 - 34.0 (pg)    MCHC 34.7  30.0 - 36.0 (g/dL)    RDW 62.9  52.8 - 41.3 (%)    Platelets  221  150 - 400 (K/uL)   PROTIME-INR     Status: Normal   Collection Time   02/10/11  6:35 AM      Component Value Range Comment   Prothrombin Time 14.2  11.6 - 15.2 (seconds)    INR 1.08  0.00 - 1.49    GLUCOSE, CAPILLARY     Status: Abnormal   Collection Time   02/10/11  7:36 AM      Component Value Range Comment   Glucose-Capillary 112 (*) 70 - 99 (mg/dL)   GLUCOSE, CAPILLARY     Status: Abnormal   Collection Time   02/10/11 11:33 AM      Component Value Range Comment   Glucose-Capillary 103 (*) 70 - 99 (mg/dL)     Disposition:  Follow-up Information    Follow up with [facility] on 02/12/2011. (10:50)    Contact information:   [place] [facility] [address] [place]      Follow up with Hillis Range, MD. (  3 months - we will arrange)    Contact information:   [address] [place] [address] [phone]       Follow up with [facility] Device 1 on 02/22/2011. (DEVICE CHECK - 10:15)    Contact information:   [facility] [address] [place]         Discharge Medications:  Current Discharge Medication List    START taking these medications   Details  aspirin EC 81 MG tablet Take 1 tablet (81 mg total) by mouth daily.    hydrochlorothiazide (HYDRODIURIL) 25 MG tablet Take 1 tablet (25 mg total) by mouth daily. Qty: 30 tablet, Refills: 6    lisinopril (PRINIVIL,ZESTRIL) 10 MG tablet Take 1 tablet (10 mg total) by mouth daily. Qty: 30 tablet, Refills: 6    nitroGLYCERIN (NITROSTAT) 0.4 MG SL tablet Place 1 tablet (0.4 mg total) under the tongue every 5 (five) minutes as needed for chest pain. Qty: 25 tablet, Refills: 3    warfarin (COUMADIN) 2.5 MG tablet Take 1 tablet (2.5 mg total) by mouth daily at 6 PM. Qty: 30 tablet, Refills: 1      CONTINUE these medications which have NOT CHANGED   Details  b complex vitamins tablet Take 1 tablet by mouth daily.      Calcium  Carbonate-Vitamin D (CALCIUM + D PO) Take by mouth.      esomeprazole (NEXIUM) 40 MG capsule Take 40 mg by mouth daily before breakfast.      glipiZIDE (GLUCOTROL) 5 MG tablet Take 5 mg by mouth daily before breakfast.     loratadine (CLARITIN) 10 MG tablet Take 10 mg by mouth daily.      Multiple Vitamin (MULTIVITAMIN) capsule Take 1 capsule by mouth daily.      docusate sodium (COLACE) 100 MG capsule Take 100 mg by mouth at bedtime as needed.      donepezil (ARICEPT) 5 MG tablet Take 5 mg by mouth every other day.        STOP taking these medications     aspirin 325 MG tablet      diltiazem (CARDIZEM CD) 180 MG 24 hr capsule      Enalapril Maleate (VASOTEC PO)      hydrochlorothiazide (MICROZIDE) 12.5 MG capsule      nitrofurantoin, macrocrystal-monohydrate, (MACROBID) 100 MG capsule         Outstanding Labs/Studies  F/U INR in coumadin clinic on 11/16.  Duration of Discharge Encounter: Greater than 30 minutes including physician time.  Signed, Nicolasa Ducking NP 02/10/2011, 12:58 PM

## 2011-02-10 NOTE — Progress Notes (Signed)
  Subjective:  "I feel better today." Minimal pain.  Objective:  Vital Signs in the last 24 hours: Temp:  [96.9 F (36.1 C)-97.6 F (36.4 C)] 96.9 F (36.1 C) (11/14 0500) Pulse Rate:  [59-60] 60  (11/14 0500) Resp:  [16-18] 18  (11/14 0500) BP: (122-149)/(63-77) 149/77 mmHg (11/14 0500) SpO2:  [95 %] 95 % (11/14 0500)  Intake/Output from previous day: 11/13 0701 - 11/14 0700 In: 600 [P.O.:600] Out: -  Intake/Output from this shift:    Physical Exam: Well appearing elderly woman, NAD HEENT: Unremarkable Neck:  No JVD, no thyromegally Lymphatics:  No adenopathy Back:  No CVA tenderness Lungs:  Clear with no wheezes, rales, or rhonchi HEART:  Regular rate rhythm, no murmurs, no rubs, no clicks Abd:  Flat, positive bowel sounds, no organomegally, no rebound, no guarding Ext:  2 plus pulses, no edema, no cyanosis, no clubbing Skin:  No rashes no nodules Neuro:  CN II through XII intact, motor grossly intact  Lab Results:  Basename 02/10/11 0635 02/09/11 0500  WBC 8.4 9.4  HGB 14.3 14.7  PLT 221 248   No results found for this basename: NA:2,K:2,CL:2,CO2:2,GLUCOSE:2,BUN:2,CREATININE:2 in the last 72 hours No results found for this basename: TROPONINI:2,CK,MB:2 in the last 72 hours Hepatic Function Panel No results found for this basename: PROT,ALBUMIN,AST,ALT,ALKPHOS,BILITOT,BILIDIR,IBILI in the last 72 hours No results found for this basename: CHOL in the last 72 hours No results found for this basename: PROTIME in the last 72 hours   Cardiac Studies: Tele - Atrial fib with a controlled VR. Assessment/Plan:  Principal Problem:  *Bradycardia - Her symptoms have resolved, s/p PPM. Active Problems:  Atrial fibrillation - She will continue low dose coumadin and followup for INR in the office in [place] in 2 days. Disposition - Will discharge to home. She has a large family to help with her convalescence.   LOS: 6 days    Lewayne Bunting 02/10/2011, 11:06 AM

## 2011-02-12 ENCOUNTER — Ambulatory Visit (INDEPENDENT_AMBULATORY_CARE_PROVIDER_SITE_OTHER): Payer: Medicare Other | Admitting: *Deleted

## 2011-02-12 DIAGNOSIS — Z7901 Long term (current) use of anticoagulants: Secondary | ICD-10-CM

## 2011-02-12 DIAGNOSIS — I4891 Unspecified atrial fibrillation: Secondary | ICD-10-CM

## 2011-02-16 ENCOUNTER — Ambulatory Visit (INDEPENDENT_AMBULATORY_CARE_PROVIDER_SITE_OTHER): Payer: Medicare Other | Admitting: *Deleted

## 2011-02-16 DIAGNOSIS — I4891 Unspecified atrial fibrillation: Secondary | ICD-10-CM

## 2011-02-16 DIAGNOSIS — Z7901 Long term (current) use of anticoagulants: Secondary | ICD-10-CM

## 2011-02-16 LAB — POCT INR: INR: 1.2

## 2011-02-22 ENCOUNTER — Ambulatory Visit (INDEPENDENT_AMBULATORY_CARE_PROVIDER_SITE_OTHER): Payer: Medicare Other | Admitting: *Deleted

## 2011-02-22 ENCOUNTER — Encounter: Payer: Self-pay | Admitting: Internal Medicine

## 2011-02-22 DIAGNOSIS — I4949 Other premature depolarization: Secondary | ICD-10-CM

## 2011-02-22 DIAGNOSIS — I498 Other specified cardiac arrhythmias: Secondary | ICD-10-CM

## 2011-02-22 DIAGNOSIS — I493 Ventricular premature depolarization: Secondary | ICD-10-CM

## 2011-02-22 DIAGNOSIS — I4891 Unspecified atrial fibrillation: Secondary | ICD-10-CM

## 2011-02-22 DIAGNOSIS — R001 Bradycardia, unspecified: Secondary | ICD-10-CM

## 2011-02-22 LAB — PACEMAKER DEVICE OBSERVATION
BRDY-0002RV: 60 {beats}/min
DEVICE MODEL PM: 112815

## 2011-02-22 NOTE — Progress Notes (Signed)
Pacer check in clinic  

## 2011-02-23 ENCOUNTER — Ambulatory Visit (INDEPENDENT_AMBULATORY_CARE_PROVIDER_SITE_OTHER): Payer: Medicare Other | Admitting: *Deleted

## 2011-02-23 DIAGNOSIS — I4891 Unspecified atrial fibrillation: Secondary | ICD-10-CM

## 2011-02-23 DIAGNOSIS — Z7901 Long term (current) use of anticoagulants: Secondary | ICD-10-CM

## 2011-02-23 MED ORDER — WARFARIN SODIUM 5 MG PO TABS: 5.0000 mg | ORAL_TABLET | ORAL | Status: DC

## 2011-02-26 ENCOUNTER — Ambulatory Visit (INDEPENDENT_AMBULATORY_CARE_PROVIDER_SITE_OTHER): Payer: Medicare Other | Admitting: *Deleted

## 2011-02-26 DIAGNOSIS — I4891 Unspecified atrial fibrillation: Secondary | ICD-10-CM

## 2011-02-26 DIAGNOSIS — Z7901 Long term (current) use of anticoagulants: Secondary | ICD-10-CM

## 2011-02-26 LAB — POCT INR: INR: 1.7

## 2011-02-26 MED ORDER — WARFARIN SODIUM 5 MG PO TABS: 5.0000 mg | ORAL_TABLET | ORAL | Status: DC

## 2011-03-05 ENCOUNTER — Ambulatory Visit (INDEPENDENT_AMBULATORY_CARE_PROVIDER_SITE_OTHER): Payer: Medicare Other | Admitting: *Deleted

## 2011-03-05 DIAGNOSIS — I4891 Unspecified atrial fibrillation: Secondary | ICD-10-CM

## 2011-03-05 DIAGNOSIS — Z7901 Long term (current) use of anticoagulants: Secondary | ICD-10-CM

## 2011-03-05 LAB — POCT INR: INR: 2.3

## 2011-03-10 ENCOUNTER — Encounter: Payer: Medicare Other | Admitting: Cardiology

## 2011-03-10 ENCOUNTER — Encounter: Payer: Self-pay | Admitting: Cardiology

## 2011-03-10 DIAGNOSIS — Z950 Presence of cardiac pacemaker: Secondary | ICD-10-CM

## 2011-03-10 DIAGNOSIS — I369 Nonrheumatic tricuspid valve disorder, unspecified: Secondary | ICD-10-CM

## 2011-03-12 ENCOUNTER — Ambulatory Visit (INDEPENDENT_AMBULATORY_CARE_PROVIDER_SITE_OTHER): Payer: Medicare Other | Admitting: *Deleted

## 2011-03-12 DIAGNOSIS — Z7901 Long term (current) use of anticoagulants: Secondary | ICD-10-CM

## 2011-03-12 DIAGNOSIS — I4891 Unspecified atrial fibrillation: Secondary | ICD-10-CM

## 2011-03-12 LAB — POCT INR: INR: 1.5

## 2011-03-19 ENCOUNTER — Ambulatory Visit (INDEPENDENT_AMBULATORY_CARE_PROVIDER_SITE_OTHER): Payer: Medicare Other | Admitting: *Deleted

## 2011-03-19 DIAGNOSIS — I4891 Unspecified atrial fibrillation: Secondary | ICD-10-CM

## 2011-03-19 DIAGNOSIS — Z7901 Long term (current) use of anticoagulants: Secondary | ICD-10-CM

## 2011-04-06 ENCOUNTER — Ambulatory Visit (INDEPENDENT_AMBULATORY_CARE_PROVIDER_SITE_OTHER): Payer: Medicare Other | Admitting: *Deleted

## 2011-04-06 DIAGNOSIS — I4891 Unspecified atrial fibrillation: Secondary | ICD-10-CM

## 2011-04-06 DIAGNOSIS — Z7901 Long term (current) use of anticoagulants: Secondary | ICD-10-CM

## 2011-04-06 LAB — POCT INR: INR: 2.2

## 2011-04-29 ENCOUNTER — Ambulatory Visit (INDEPENDENT_AMBULATORY_CARE_PROVIDER_SITE_OTHER): Payer: Medicare Other | Admitting: *Deleted

## 2011-04-29 ENCOUNTER — Encounter: Payer: Self-pay | Admitting: Cardiology

## 2011-04-29 ENCOUNTER — Ambulatory Visit (INDEPENDENT_AMBULATORY_CARE_PROVIDER_SITE_OTHER): Payer: Medicare Other | Admitting: Cardiology

## 2011-04-29 VITALS — BP 124/83 | HR 61 | Ht 65.0 in | Wt 137.0 lb

## 2011-04-29 DIAGNOSIS — Z7901 Long term (current) use of anticoagulants: Secondary | ICD-10-CM

## 2011-04-29 DIAGNOSIS — I4891 Unspecified atrial fibrillation: Secondary | ICD-10-CM

## 2011-04-29 DIAGNOSIS — I2789 Other specified pulmonary heart diseases: Secondary | ICD-10-CM

## 2011-04-29 DIAGNOSIS — I272 Pulmonary hypertension, unspecified: Secondary | ICD-10-CM | POA: Insufficient documentation

## 2011-04-29 DIAGNOSIS — J189 Pneumonia, unspecified organism: Secondary | ICD-10-CM | POA: Insufficient documentation

## 2011-04-29 DIAGNOSIS — Z950 Presence of cardiac pacemaker: Secondary | ICD-10-CM | POA: Insufficient documentation

## 2011-04-29 NOTE — Progress Notes (Signed)
Tasha Bottoms, MD, [facility] ABIM Board Certified in Adult Cardiovascular Medicine,Internal Medicine and Critical Care Medicine    CC: Followup patient after recent hospitalization with pneumonia after receiving single-lead pacemaker implantation for tachybradycardia syndrome  HPI:  The patient is a 78 year old female with a history of chronic atrial fibrillation on Coumadin therapy. She has a tachybradycardia syndrome and underwent pacemaker implantation in December of 2012. After she was admitted to [facility] with pneumonia. An echocardiogram was performed and there was no evidence of microperforation or any complications related to her pacemaker. She has recovered from her pneumonia quite well although she still has an occasional cough. She reports no purulence. She also reports that her pacemaker site is somewhat sore particularly in the upper edge were the  screws are actually pressing on her very fragile skin. However the skin remains intact over that site. She has no chest pain orthopnea or PND.   PMH: reviewed and listed in Problem List in Electronic Records (and see below) Past Medical History  Diagnosis Date  . Diabetes mellitus   . Pneumonia     Hospitalized 03/10/2011  . Atrial fibrillation     Tachybradycardia syndrome  . Hypertension   . Chest pain 2001    nonobstructive dz by LHC in 2001, recent echocardiogram 2012 ejection fraction 65-70%   . Pacemaker     [employer] single-lead pacemaker  . Pulmonary hypertension     PA pressures of 77 mmHg December 2012   Past Surgical History  Procedure Date  . Abdominal hysterectomy   . Breast surgery     60 yrs ago after childbirth breast lanced  . Eye surgery     Cataract Sx     Allergies/SH/FHX : available in Electronic Records for review  Not on File History   Social History  . Marital Status: Widowed    Spouse Name: N/A    Number of Children: N/A  . Years of Education: N/A   Occupational History  . Not on  file.   Social History Main Topics  . Smoking status: Never Smoker   . Smokeless tobacco: Never Used  . Alcohol Use: No  . Drug Use: No  . Sexually Active: Not on file   Other Topics Concern  . Not on file   Social History Narrative  . No narrative on file   Family History  Problem Relation Age of Onset  . Diabetes Father   . Coronary artery disease Brother     AMI in 12s    Medications: Current Outpatient Prescriptions  Medication Sig Dispense Refill  . aspirin EC 81 MG tablet Take 1 tablet (81 mg total) by mouth daily.      Marland Kitchen b complex vitamins tablet Take 1 tablet by mouth daily.        . Calcium Carbonate-Vitamin D (CALCIUM + D PO) Take by mouth.        . docusate sodium (COLACE) 100 MG capsule Take 100 mg by mouth every other day.       . donepezil (ARICEPT) 5 MG tablet Take 5 mg by mouth at bedtime.       Marland Kitchen esomeprazole (NEXIUM) 40 MG capsule Take 40 mg by mouth daily before breakfast.        . glipiZIDE (GLUCOTROL XL) 5 MG 24 hr tablet Take 5 mg by mouth daily.      . hydrochlorothiazide (HYDRODIURIL) 25 MG tablet Take 1 tablet (25 mg total) by mouth daily.  30 tablet  6  . lisinopril (PRINIVIL,ZESTRIL) 10 MG tablet Take 1 tablet (10 mg total) by mouth daily.  30 tablet  6  . loratadine (CLARITIN) 10 MG tablet Take 10 mg by mouth daily.        . Multiple Vitamin (MULTIVITAMIN) capsule Take 1 capsule by mouth daily.        . nitrofurantoin (MACRODANTIN) 50 MG capsule Take 50 mg by mouth daily.      . nitroGLYCERIN (NITROSTAT) 0.4 MG SL tablet Place 1 tablet (0.4 mg total) under the tongue every 5 (five) minutes as needed for chest pain.  25 tablet  3  . OVER THE COUNTER MEDICATION Take 1 tablet by mouth daily. Magnesium/Zinc 400/15      . warfarin (COUMADIN) 5 MG tablet Take 1 tablet (5 mg total) by mouth as directed. Take 1 1/2 tablets daily   45 tablet  3    ROS: No nausea or vomiting. No fever or chills.No melena or hematochezia.No bleeding.No  claudication  Physical Exam: BP 124/83  Pulse 61  Ht 5\' 5"  (1.651 m)  Wt 137 lb (62.143 kg)  BMI 22.80 kg/m2 General: Well-nourished white female in no distress Neck: Normal carotid upstroke no carotid bruits. No thyromegaly nonnodular thyroid. JVP is 6 cm Chest: Pacemaker pocket site was inspected at the left upper corner of her pocket was discard tissue is there is a screw without is visible underneath a very fragile skin. There is no perforation however. Lungs: Clear breath sounds bilaterally without wheezing Cardiac: Irregular rate and rhythm normal S1-S2 Vascular: No edema. Normal distal pulses Skin: Warm and dry Physcologic: Normal affect  12lead ECG: Not obtained Limited bedside ECHO:N/A   Patient Active Problem List  Diagnoses  . Bradycardia-resolved post pacemaker implantation   . Atrial fibrillation-chronic on anticoagulation   . PVC (premature ventricular contraction)  . Chest pain-no recurrence   . Hypertension-controlled   . Encounter for long-term (current) use of anticoagulants  . Pulmonary hypertension-moderate to severe   . Pacemaker -single-lead [employer] pacemaker   . Pneumonia    PLAN   I asked the patient to apply some gauze or at least keep her bra away from the site of the pacemaker is some concern that the pacemaker may perforate the skin at the site of the screws were deleted attached to the pacemaker.  I've instructed her to give [place] a call as soon as he sees any perforation.  From a cardiac standpoint otherwise she's doing well.  Although she has severe pulmonary hypertension, the patient still gets around and is able to do her daily house chores. I do not think there is any reason to follow this up we'll consider any treatment.  I've asked the patient to followup with her primary care physician particularly if her cough worsens again

## 2011-04-29 NOTE — Patient Instructions (Signed)
   Coumadin check today - 3.7 (see sheet given for instructions) Your physician wants you to follow up in:  1 year.  You will receive a reminder letter in the mail one-two months in advance.  If you don't receive a letter, please call our office to schedule the follow up appointment

## 2011-05-04 ENCOUNTER — Ambulatory Visit (INDEPENDENT_AMBULATORY_CARE_PROVIDER_SITE_OTHER): Payer: Medicare Other | Admitting: *Deleted

## 2011-05-04 DIAGNOSIS — Z7901 Long term (current) use of anticoagulants: Secondary | ICD-10-CM

## 2011-05-04 DIAGNOSIS — I4891 Unspecified atrial fibrillation: Secondary | ICD-10-CM

## 2011-05-07 ENCOUNTER — Ambulatory Visit (INDEPENDENT_AMBULATORY_CARE_PROVIDER_SITE_OTHER): Payer: Medicare Other | Admitting: Internal Medicine

## 2011-05-07 ENCOUNTER — Encounter: Payer: Self-pay | Admitting: Internal Medicine

## 2011-05-07 DIAGNOSIS — I498 Other specified cardiac arrhythmias: Secondary | ICD-10-CM

## 2011-05-07 DIAGNOSIS — R079 Chest pain, unspecified: Secondary | ICD-10-CM

## 2011-05-07 DIAGNOSIS — R001 Bradycardia, unspecified: Secondary | ICD-10-CM

## 2011-05-07 DIAGNOSIS — I4891 Unspecified atrial fibrillation: Secondary | ICD-10-CM

## 2011-05-07 LAB — PACEMAKER DEVICE OBSERVATION
BRDY-0002RV: 60 {beats}/min
BRDY-0003RV: 115 {beats}/min
RV LEAD AMPLITUDE: 17.3 mv
RV LEAD THRESHOLD: 0.7 V

## 2011-05-07 NOTE — Patient Instructions (Signed)
Your physician wants you to follow-up in: 9 months with Dr Allred You will receive a reminder letter in the mail two months in advance. If you don't receive a letter, please call our office to schedule the follow-up appointment.  

## 2011-05-07 NOTE — Assessment & Plan Note (Signed)
Normal pacemaker function See Pace Art report  Rate response increased today  EKG today reveals afib, V paced

## 2011-05-07 NOTE — Progress Notes (Signed)
PCP:  Criss Rosales, MD, MD  The patient presents today for routine electrophysiology followup.  Since her ppm was implanted, the patient reports doing very well.   Her energy has significantly improved. Today, she denies symptoms of palpitations, chest pain, shortness of breath, dizziness, presyncope, syncope, or neurologic sequela.  The patient feels that she is tolerating medications without difficulties and is otherwise without complaint today.   Past Medical History  Diagnosis Date  . Diabetes mellitus   . Pneumonia     Hospitalized 03/10/2011  . Atrial fibrillation     permanent afib  . Hypertension   . Chest pain 2001    nonobstructive dz by LHC in 2001, recent echocardiogram 2012 ejection fraction 65-70%   . Physicist, medical  . Pulmonary hypertension     PA pressures of 77 mmHg December 2012   Past Surgical History  Procedure Date  . Abdominal hysterectomy   . Breast surgery     60 yrs ago after childbirth breast lanced  . Eye surgery     Cataract Sx   . Pacemaker insertion 11/12    [employer] PPM implanted by Dr Ladona Ridgel    Current Outpatient Prescriptions  Medication Sig Dispense Refill  . aspirin EC 81 MG tablet Take 1 tablet (81 mg total) by mouth daily.      Marland Kitchen b complex vitamins tablet Take 1 tablet by mouth daily.        . Calcium Carbonate-Vitamin D (CALCIUM + D PO) Take by mouth.        . docusate sodium (COLACE) 100 MG capsule Take 100 mg by mouth every other day.       . donepezil (ARICEPT) 5 MG tablet Take 5 mg by mouth at bedtime.       Marland Kitchen esomeprazole (NEXIUM) 40 MG capsule Take 40 mg by mouth daily before breakfast.        . glipiZIDE (GLUCOTROL XL) 5 MG 24 hr tablet Take 5 mg by mouth daily.      . hydrochlorothiazide (HYDRODIURIL) 25 MG tablet Take 1 tablet (25 mg total) by mouth daily.  30 tablet  6  . lisinopril (PRINIVIL,ZESTRIL) 10 MG tablet Take 1 tablet (10 mg total) by mouth daily.  30  tablet  6  . loratadine (CLARITIN) 10 MG tablet Take 10 mg by mouth daily.        . nitrofurantoin (MACRODANTIN) 50 MG capsule Take 50 mg by mouth daily.      . nitroGLYCERIN (NITROSTAT) 0.4 MG SL tablet Place 1 tablet (0.4 mg total) under the tongue every 5 (five) minutes as needed for chest pain.  25 tablet  3  . OVER THE COUNTER MEDICATION Take 1 tablet by mouth daily. Magnesium/Zinc 400/15      . warfarin (COUMADIN) 5 MG tablet Take 1 tablet (5 mg total) by mouth as directed. Take 1 1/2 tablets daily   45 tablet  3  . Multiple Vitamin (MULTIVITAMIN) capsule Take 1 capsule by mouth daily.          No Known Allergies  History   Social History  . Marital Status: Widowed    Spouse Name: N/A    Number of Children: N/A  . Years of Education: N/A   Occupational History  . Not on file.   Social History Main Topics  . Smoking status: Never Smoker   . Smokeless tobacco: Never Used  . Alcohol Use: No  . Drug Use:  No  . Sexually Active: Not on file   Other Topics Concern  . Not on file   Social History Narrative  . No narrative on file    Family History  Problem Relation Age of Onset  . Diabetes Father   . Coronary artery disease Brother     AMI in 76s     Physical Exam: Filed Vitals:   05/07/11 1032  BP: 112/74  Pulse: 60  Height: 5\' 5"  (1.651 m)  Weight: 138 lb (62.596 kg)    GEN- The patient is well appearing, alert and oriented x 3 today.   Head- normocephalic, atraumatic Eyes-  Sclera clear, conjunctiva pink Ears- hearing intact Oropharynx- clear Neck- supple, no JVP Lymph- no cervical lymphadenopathy Lungs- Clear to ausculation bilaterally, normal work of breathing Chest- pacemaker pocket is well healed Heart- Regular rate and rhythm, no murmurs, rubs or gallops, PMI not laterally displaced GI- soft, NT, ND, + BS Extremities- no clubbing, cyanosis, or edema  Pacemaker interrogation- reviewed in detail today,  See PACEART report  Assessment and  Plan:

## 2011-05-07 NOTE — Assessment & Plan Note (Signed)
Stable No change required today Goal INR 2-3 

## 2011-05-19 ENCOUNTER — Ambulatory Visit: Payer: Self-pay | Admitting: *Deleted

## 2011-05-19 ENCOUNTER — Telehealth: Payer: Self-pay | Admitting: *Deleted

## 2011-05-19 DIAGNOSIS — Z7901 Long term (current) use of anticoagulants: Secondary | ICD-10-CM

## 2011-05-19 DIAGNOSIS — I4891 Unspecified atrial fibrillation: Secondary | ICD-10-CM

## 2011-05-19 NOTE — Telephone Encounter (Signed)
Removed pt from anticoagulation clinic.

## 2011-05-19 NOTE — Telephone Encounter (Signed)
Mrs. [name] (daughter) called today to cancel her mother's next coumdin appointment. States that her mother Is going to start going to Dr. Dimas Aguas for future checks.

## 2011-08-12 ENCOUNTER — Other Ambulatory Visit: Payer: Self-pay | Admitting: Nurse Practitioner

## 2012-04-25 ENCOUNTER — Encounter: Payer: Self-pay | Admitting: *Deleted

## 2012-06-24 IMAGING — CR DG CHEST 1V PORT
1 series · 1 of 1 positions shown · non-contrast
Comparison: None.

CLINICAL DATA: Chest pain

PORTABLE CHEST - 1 VIEW

[AP]
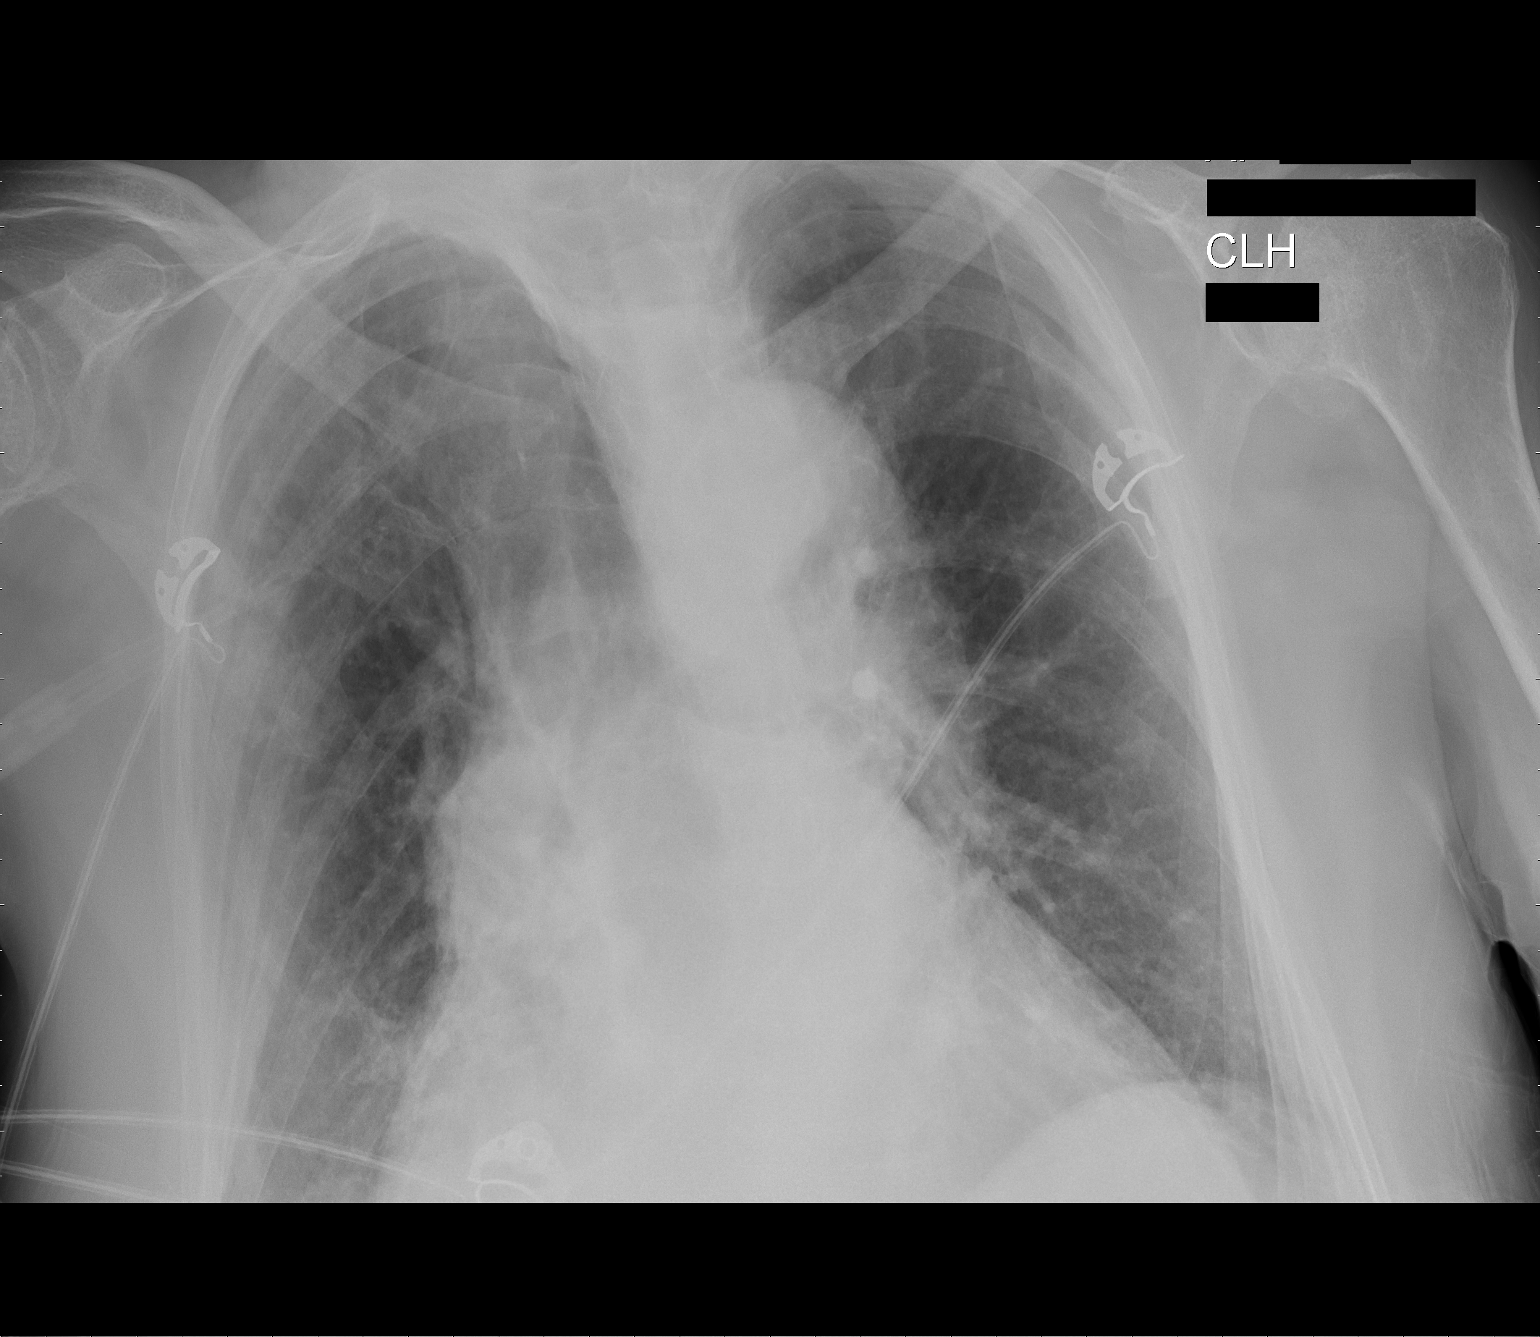

[1 of 1 positions shown; findings below may reference images not displayed]

FINDINGS: The heart is markedly enlarged.  Pulmonary vascularity is
within normal limits.  Thorax is rotated to the right. Mid and
upper lungs are grossly clear.  No pneumothorax or pleural
effusion. Mild bibasilar atelectasis.
IMPRESSION: Cardiomegaly without edema.

## 2012-06-29 ENCOUNTER — Telehealth: Payer: Self-pay | Admitting: Internal Medicine

## 2012-06-29 NOTE — Telephone Encounter (Signed)
06-29-12 called pt re past due device check, having checks done with dr de gent/mt

## 2012-06-29 NOTE — Telephone Encounter (Signed)
Noted in paceart/kwm

## 2012-09-15 ENCOUNTER — Ambulatory Visit (INDEPENDENT_AMBULATORY_CARE_PROVIDER_SITE_OTHER): Payer: Medicare Other | Admitting: *Deleted

## 2012-09-15 DIAGNOSIS — R001 Bradycardia, unspecified: Secondary | ICD-10-CM

## 2012-09-15 DIAGNOSIS — I498 Other specified cardiac arrhythmias: Secondary | ICD-10-CM

## 2012-09-15 LAB — PACEMAKER DEVICE OBSERVATION
BRDY-0003RV: 115 {beats}/min
RV LEAD AMPLITUDE: 17.7 mv
RV LEAD THRESHOLD: 0.7 V

## 2012-09-15 NOTE — Progress Notes (Signed)
PPM check in office. 

## 2012-10-19 ENCOUNTER — Encounter: Payer: Self-pay | Admitting: Internal Medicine

## 2013-03-01 ENCOUNTER — Ambulatory Visit (INDEPENDENT_AMBULATORY_CARE_PROVIDER_SITE_OTHER): Payer: Medicare Other | Admitting: Internal Medicine

## 2013-03-01 ENCOUNTER — Encounter: Payer: Self-pay | Admitting: Internal Medicine

## 2013-03-01 VITALS — BP 163/76 | HR 60 | Ht 65.0 in | Wt 147.0 lb

## 2013-03-01 DIAGNOSIS — Z950 Presence of cardiac pacemaker: Secondary | ICD-10-CM

## 2013-03-01 DIAGNOSIS — I498 Other specified cardiac arrhythmias: Secondary | ICD-10-CM

## 2013-03-01 DIAGNOSIS — R001 Bradycardia, unspecified: Secondary | ICD-10-CM

## 2013-03-01 DIAGNOSIS — Z7901 Long term (current) use of anticoagulants: Secondary | ICD-10-CM

## 2013-03-01 DIAGNOSIS — I4891 Unspecified atrial fibrillation: Secondary | ICD-10-CM

## 2013-03-01 DIAGNOSIS — Z79899 Other long term (current) drug therapy: Secondary | ICD-10-CM

## 2013-03-01 DIAGNOSIS — I10 Essential (primary) hypertension: Secondary | ICD-10-CM

## 2013-03-01 LAB — MDC_IDC_ENUM_SESS_TYPE_INCLINIC
Lead Channel Pacing Threshold Amplitude: 0.7 V
Lead Channel Pacing Threshold Pulse Width: 0.4 ms
Lead Channel Setting Pacing Pulse Width: 0.4 ms
Lead Channel Setting Sensing Sensitivity: 2.5 mV

## 2013-03-01 MED ORDER — APIXABAN 5 MG PO TABS: 5.0000 mg | ORAL_TABLET | Freq: Two times a day (BID) | ORAL | Status: DC

## 2013-03-01 NOTE — Patient Instructions (Addendum)
   Stop Aspirin  Begin Eliquis 5mg  twice a day  - new sent to pharm Continue all other medications.    Lab for BMET, CBC Office will contact with results via phone or letter.   Follow up in  4 weeks with Misty Stanley (anticoagulation nurse)  Your physician wants you to follow up in:  1 year.  You will receive a reminder letter in the mail one-two months in advance.  If you don't receive a letter, please call our office to schedule the follow up appointment - Allred Establish with general cardiologist in 6 months - Dr. Wyline Mood

## 2013-03-01 NOTE — Progress Notes (Signed)
PCP:  Selinda Flavin, MD Primary Cardiologist:  Previously Dr Andee Lineman (will refer to Dr Wyline Mood)  The patient presents today for routine electrophysiology followup.  Since her last visit, the patient reports doing very well.   Today, she denies symptoms of palpitations, chest pain, shortness of breath, dizziness, presyncope, syncope, or neurologic sequela.  The patient feels that she is tolerating medications without difficulties and is otherwise without complaint today.   Past Medical History  Diagnosis Date  . Diabetes mellitus   . Pneumonia     Hospitalized 03/10/2011  . Atrial fibrillation     permanent afib  . Hypertension   . Chest pain 2001    nonobstructive dz by LHC in 2001, recent echocardiogram 2012 ejection fraction 65-70%   . Complete heart block     Technical sales engineer  . Pulmonary hypertension     PA pressures of 77 mmHg December 2012   Past Surgical History  Procedure Laterality Date  . Abdominal hysterectomy    . Breast surgery      60 yrs ago after childbirth breast lanced  . Eye surgery      Cataract Sx   . Pacemaker insertion  11/12    [employer] PPM implanted by Dr Ladona Ridgel    Current Outpatient Prescriptions  Medication Sig Dispense Refill  . b complex vitamins tablet Take 1 tablet by mouth daily.        . Calcium Carbonate-Vitamin D (CALCIUM + D PO) Take by mouth.        . docusate sodium (COLACE) 100 MG capsule Take 100 mg by mouth every other day.       . donepezil (ARICEPT) 5 MG tablet Take 5 mg by mouth at bedtime.       Marland Kitchen esomeprazole (NEXIUM) 40 MG capsule Take 40 mg by mouth daily before breakfast.        . glipiZIDE (GLUCOTROL XL) 5 MG 24 hr tablet Take 5 mg by mouth daily.      . hydrochlorothiazide (HYDRODIURIL) 25 MG tablet Take 25 mg by mouth daily.      Marland Kitchen lisinopril (PRINIVIL,ZESTRIL) 10 MG tablet TAKE (1) TABLET BY MOUTH ONCE DAILY.  90 tablet  2  . loratadine (CLARITIN) 10 MG tablet Take 10 mg by mouth daily.         . Multiple Vitamin (MULTIVITAMIN) capsule Take 1 capsule by mouth daily.        . nitrofurantoin (MACRODANTIN) 50 MG capsule Take 50 mg by mouth daily.      . nitroGLYCERIN (NITROSTAT) 0.4 MG SL tablet Place 0.4 mg under the tongue every 5 (five) minutes as needed for chest pain.      Marland Kitchen OVER THE COUNTER MEDICATION Take 1 tablet by mouth daily. Magnesium/Zinc 400/15      . warfarin (COUMADIN) 5 MG tablet Take 1 tablet (5 mg total) by mouth as directed. Take 1 1/2 tablets daily   45 tablet  3   No current facility-administered medications for this visit.    No Known Allergies  History   Social History  . Marital Status: Widowed    Spouse Name: N/A    Number of Children: N/A  . Years of Education: N/A   Occupational History  . Not on file.   Social History Main Topics  . Smoking status: Never Smoker   . Smokeless tobacco: Never Used  . Alcohol Use: No  . Drug Use: No  . Sexual Activity: Not on file  Other Topics Concern  . Not on file   Social History Narrative  . No narrative on file    Family History  Problem Relation Age of Onset  . Diabetes Father   . Coronary artery disease Brother     AMI in 45s     Physical Exam: Filed Vitals:   03/01/13 0958  BP: 163/76  Pulse: 60  Height: 5\' 5"  (1.651 m)  Weight: 147 lb (66.679 kg)    GEN- The patient is well appearing, alert and oriented x 3 today.   Head- normocephalic, atraumatic, 1cm x 1 cm open/ draining skin cancer on her forehead Eyes-  Sclera clear, conjunctiva pink Ears- hearing intact Oropharynx- clear Neck- supple  Lungs- Clear to ausculation bilaterally, normal work of breathing Chest- pacemaker pocket is well healed Heart- Regular rate and rhythm, no murmurs, rubs or gallops, PMI not laterally displaced GI- soft, NT, ND, + BS Extremities- no clubbing, cyanosis, or edema  Pacemaker interrogation- reviewed in detail today,  See PACEART report  Assessment and Plan:  1. Complete heart  block Normal pacemaker function See Pace Art report No changes today Will enroll in [facility]  2. Permanent afib Her coumadin was previously discontinued and she is on ASA.  Her CHADS2VASC score is at least 5.  She should be anticoagulated long term. Today, I discussed coumadin and novel anticoagulants including pradaxa, xarelto, and eliquis today as indicated for risk reduction in stroke and systemic emboli with nonvalvular atrial fibrillation.  Given her advanced age, I suspect that eliquis is the safest option.  We reviewed the data of the AVERROES trial today which revealed significantly less stroke with eliquis without statistical increase in bleeding. Risks, benefits, and alternatives to eilquis was discussed at length today.  She wishes to proceed. Stop ASA and start eliquis 5mg  BID Check CBC and BMET today Return to see Misty Stanley in the anticoagulation clinic in 4 weeks.  I would like for Misty Stanley to see her at least twice per year going forward.  3. HTN/ CAD BP is elevated today No change required today Will refer to [facility] for long term CAD management  Return to see me in 1 year [facility]

## 2013-03-02 ENCOUNTER — Encounter: Payer: Self-pay | Admitting: Internal Medicine

## 2013-03-08 ENCOUNTER — Encounter: Payer: Self-pay | Admitting: Internal Medicine

## 2013-03-09 ENCOUNTER — Other Ambulatory Visit: Payer: Self-pay | Admitting: *Deleted

## 2013-03-09 NOTE — Patient Instructions (Signed)
Pt started on Eliquis 5mg  bid on 03/01/13 by Allred.  Reviewed CBC and BMP drawn 03/01/13. Age 79 Wt  66.6kg Cr. 0.71 Hgb  13.8  Hct  41.4 Dose appropriate for pt.  Will see pt in F/U in 4 wks with repeat labs.

## 2013-04-12 ENCOUNTER — Encounter: Payer: Self-pay | Admitting: Cardiovascular Disease

## 2013-06-05 ENCOUNTER — Telehealth: Payer: Self-pay | Admitting: Cardiovascular Disease

## 2013-06-05 NOTE — Telephone Encounter (Signed)
Patient called and does not want to schedule a 6 month visit.  She is seeing her PCP (Dr. Nadara Mustard) and will have her pacemaker checked in the fall.

## 2013-08-17 ENCOUNTER — Other Ambulatory Visit: Payer: Self-pay | Admitting: *Deleted

## 2013-08-17 MED ORDER — NITROGLYCERIN 0.4 MG SL SUBL: 0.4000 mg | SUBLINGUAL_TABLET | SUBLINGUAL | Status: DC | PRN

## 2013-09-24 ENCOUNTER — Ambulatory Visit (INDEPENDENT_AMBULATORY_CARE_PROVIDER_SITE_OTHER): Payer: Medicare Other | Admitting: Cardiovascular Disease

## 2013-09-24 ENCOUNTER — Encounter: Payer: Self-pay | Admitting: Cardiovascular Disease

## 2013-09-24 VITALS — BP 148/80 | HR 61 | Temp 97.3°F | Ht 65.0 in | Wt 141.0 lb

## 2013-09-24 DIAGNOSIS — Z950 Presence of cardiac pacemaker: Secondary | ICD-10-CM

## 2013-09-24 DIAGNOSIS — I482 Chronic atrial fibrillation, unspecified: Secondary | ICD-10-CM

## 2013-09-24 DIAGNOSIS — I10 Essential (primary) hypertension: Secondary | ICD-10-CM

## 2013-09-24 DIAGNOSIS — I4891 Unspecified atrial fibrillation: Secondary | ICD-10-CM

## 2013-09-24 MED ORDER — ASPIRIN EC 81 MG PO TBEC: 81.0000 mg | DELAYED_RELEASE_TABLET | Freq: Every day | ORAL | Status: AC

## 2013-09-24 NOTE — Patient Instructions (Signed)
   Remain off the Eliquis  Aspirin 81mg  daily  Continue all other medications.   Your physician wants you to follow up in:  1 year.  You will receive a reminder letter in the mail one-two months in advance.  If you don't receive a letter, please call our office to schedule the follow up appointment

## 2013-09-24 NOTE — Progress Notes (Signed)
Patient ID: AKILA BATTA, female   DOB: 02/18/1918, 80 y.o.   MRN: [id number]      SUBJECTIVE: The patient is a 80 year old woman who I'm evaluating for the first time. She has a pacemaker for tachycardia-bradycardia syndrome, atrial fibrillation, essential hypertension, pulmonary hypertension, and a history of nonobstructive coronary artery disease. She had been prescribed Eliquis by Dr. Rayann Heman, but after a long discussion with family members which includes two nurses, the decision was made to not take any anticoagulants whatsoever. She does take a baby aspirin daily. She is uncertain as to why she is here for her office visit today as she is feeling so well, and denies chest pain, shortness of breath, orthopnea, palpitations, and leg swelling. She has had some diarrhea over the past one or two days but denies fevers and chills. She also denies nausea and vomiting. ECG in the office today demonstrates a ventricular paced rhythm, heart rate 60 beats per minute.   No Known Allergies  Current Outpatient Prescriptions  Medication Sig Dispense Refill  . apixaban (ELIQUIS) 5 MG TABS tablet Take 1 tablet (5 mg total) by mouth 2 (two) times daily.  60 tablet  0  . b complex vitamins tablet Take 1 tablet by mouth daily.        . Calcium Carbonate-Vitamin D (CALCIUM + D PO) Take by mouth.        . docusate sodium (COLACE) 100 MG capsule Take 100 mg by mouth every other day.       . donepezil (ARICEPT) 5 MG tablet Take 5 mg by mouth at bedtime.       Marland Kitchen esomeprazole (NEXIUM) 40 MG capsule Take 40 mg by mouth daily before breakfast.        . glipiZIDE (GLUCOTROL XL) 5 MG 24 hr tablet Take 5 mg by mouth daily.      . hydrochlorothiazide (HYDRODIURIL) 25 MG tablet Take 25 mg by mouth daily.      Marland Kitchen lisinopril (PRINIVIL,ZESTRIL) 10 MG tablet TAKE (1) TABLET BY MOUTH ONCE DAILY.  90 tablet  2  . loratadine (CLARITIN) 10 MG tablet Take 10 mg by mouth daily.        . Multiple Vitamin (MULTIVITAMIN) capsule Take 1  capsule by mouth daily.        . nitrofurantoin (MACRODANTIN) 50 MG capsule Take 50 mg by mouth daily.      . nitroGLYCERIN (NITROSTAT) 0.4 MG SL tablet Place 1 tablet (0.4 mg total) under the tongue every 5 (five) minutes as needed for chest pain.  25 tablet  3  . OVER THE COUNTER MEDICATION Take 1 tablet by mouth daily. Magnesium/Zinc 400/15       No current facility-administered medications for this visit.    Past Medical History  Diagnosis Date  . Diabetes mellitus   . Pneumonia     Hospitalized 03/10/2011  . Atrial fibrillation     permanent afib  . Hypertension   . Chest pain 2001    nonobstructive dz by LHC in 2001, recent echocardiogram 2012 ejection fraction 65-70%   . Complete heart block     Engineer, maintenance  . Pulmonary hypertension     PA pressures of 77 mmHg December 2012    Past Surgical History  Procedure Laterality Date  . Abdominal hysterectomy    . Breast surgery      60 yrs ago after childbirth breast lanced  . Eye surgery      Cataract Sx   .  Pacemaker insertion  11/12    [employer] PPM implanted by Dr Lovena Le    History   Social History  . Marital Status: Widowed    Spouse Name: N/A    Number of Children: N/A  . Years of Education: N/A   Occupational History  . Not on file.   Social History Main Topics  . Smoking status: Never Smoker   . Smokeless tobacco: Never Used  . Alcohol Use: No  . Drug Use: No  . Sexual Activity: Not on file   Other Topics Concern  . Not on file   Social History Narrative  . No narrative on file     Filed Vitals:   09/24/13 1038  BP: 148/80  Pulse: 61  Temp: 97.3 F (36.3 C)  Height: 5\' 5"  (1.651 m)  Weight: 141 lb (63.957 kg)    PHYSICAL EXAM General: NAD Neck: No JVD, no thyromegaly. Lungs: Clear to auscultation bilaterally with normal respiratory effort. CV: Nondisplaced PMI.  Regular rate and rhythm, normal S1/S2, no S3/S4, no murmur. No pretibial or periankle  edema.  Abdomen: Soft, nontender, no hepatosplenomegaly, no distention.  Neurologic: Alert and oriented x 3.  Psych: Normal affect. Extremities: No clubbing or cyanosis. Venous varicosities b/l.  ECG: reviewed and available in electronic records.      ASSESSMENT AND PLAN: 1. Atrial fibrillation: Decision made by patient and family members to remain off anticoagulants. She is taking aspirin 81 mg daily. 2. HTN: Reasonably controlled for age on hydrochlorothiazide 25 mg daily and lisinopril 10 mg daily. 3. CAD: Nonobstructive by last cath. Asymptomatic. 4. Pacemaker: Followed by Dr. Rayann Heman. Normal device function in December 2014.  Dispo: f/u 1 year.  Kate Sable, M.D., F.A.C.C.

## 2013-10-11 ENCOUNTER — Ambulatory Visit (INDEPENDENT_AMBULATORY_CARE_PROVIDER_SITE_OTHER): Payer: Medicare Other | Admitting: *Deleted

## 2013-10-11 DIAGNOSIS — I480 Paroxysmal atrial fibrillation: Secondary | ICD-10-CM

## 2013-10-11 DIAGNOSIS — I4891 Unspecified atrial fibrillation: Secondary | ICD-10-CM

## 2013-10-11 LAB — MDC_IDC_ENUM_SESS_TYPE_INCLINIC
Brady Statistic RV Percent Paced: 97 %
Date Time Interrogation Session: 20150716040000
Implantable Pulse Generator Serial Number: 112815
Lead Channel Sensing Intrinsic Amplitude: 14.1 mV
Lead Channel Setting Pacing Amplitude: 2.4 V
Lead Channel Setting Sensing Sensitivity: 2.5 mV
MDC IDC MSMT LEADCHNL RV IMPEDANCE VALUE: 686 Ohm
MDC IDC MSMT LEADCHNL RV PACING THRESHOLD AMPLITUDE: 0.7 V
MDC IDC MSMT LEADCHNL RV PACING THRESHOLD PULSEWIDTH: 0.4 ms
MDC IDC SET LEADCHNL RV PACING PULSEWIDTH: 0.4 ms
MDC IDC SET ZONE DETECTION INTERVAL: 375 ms

## 2013-10-11 NOTE — Progress Notes (Signed)
Pacemaker check in clinic. Battery longevity 8.5 years. Normal device function. 2 nst episodes--both 6 beats long. Pt complains of pacer site due to thinning skin. Pt was instructed with any changes to site to call office. ROV in 6 mths w/JA in [place] office.

## 2013-11-09 ENCOUNTER — Encounter: Payer: Self-pay | Admitting: Internal Medicine

## 2014-03-07 ENCOUNTER — Encounter (HOSPITAL_COMMUNITY): Payer: Self-pay | Admitting: Internal Medicine

## 2014-03-08 ENCOUNTER — Encounter: Payer: Medicare Other | Admitting: Internal Medicine

## 2014-05-03 ENCOUNTER — Encounter: Payer: Medicare Other | Admitting: Internal Medicine

## 2014-05-03 DIAGNOSIS — R634 Abnormal weight loss: Secondary | ICD-10-CM | POA: Diagnosis not present

## 2014-05-03 DIAGNOSIS — Z950 Presence of cardiac pacemaker: Secondary | ICD-10-CM | POA: Diagnosis not present

## 2014-05-03 DIAGNOSIS — G309 Alzheimer's disease, unspecified: Secondary | ICD-10-CM | POA: Diagnosis not present

## 2014-05-31 ENCOUNTER — Ambulatory Visit (INDEPENDENT_AMBULATORY_CARE_PROVIDER_SITE_OTHER): Payer: Medicare Other | Admitting: Internal Medicine

## 2014-05-31 ENCOUNTER — Encounter: Payer: Self-pay | Admitting: Internal Medicine

## 2014-05-31 VITALS — BP 135/73 | HR 61 | Ht 65.0 in | Wt 146.8 lb

## 2014-05-31 DIAGNOSIS — I482 Chronic atrial fibrillation, unspecified: Secondary | ICD-10-CM

## 2014-05-31 DIAGNOSIS — R001 Bradycardia, unspecified: Secondary | ICD-10-CM

## 2014-05-31 DIAGNOSIS — I10 Essential (primary) hypertension: Secondary | ICD-10-CM | POA: Diagnosis not present

## 2014-05-31 DIAGNOSIS — I442 Atrioventricular block, complete: Secondary | ICD-10-CM

## 2014-05-31 LAB — MDC_IDC_ENUM_SESS_TYPE_INCLINIC
Lead Channel Setting Pacing Pulse Width: 0.4 ms
MDC IDC MSMT LEADCHNL RV IMPEDANCE VALUE: 695 Ohm
MDC IDC MSMT LEADCHNL RV SENSING INTR AMPL: 15.5 mV
MDC IDC PG SERIAL: 112815
MDC IDC SESS DTM: 20160304050000
MDC IDC SET LEADCHNL RV PACING AMPLITUDE: 2.4 V
MDC IDC SET LEADCHNL RV SENSING SENSITIVITY: 2.5 mV
Zone Setting Detection Interval: 375 ms

## 2014-05-31 NOTE — Progress Notes (Signed)
Electrophysiology Office Note   Date:  05/31/2014   ID:  [name], DOB 1917/08/21, MRN [id number]  PCP:  Rory Percy, MD  Cardiologist:  Dr Bronson Ing Primary Electrophysiologist: Thompson Grayer, MD    Chief Complaint  Patient presents with  . Atrial Fibrillation     History of Present Illness: [name] is a 81 y.o. female who presents today for electrophysiology evaluation.   She is doing very well for her age.  She remains active.  She is without cardiac issues. She did not start eliquis as advised.  She states that her family got together and decided that with her advanced age that they thought that risks were too high.  Today, she denies symptoms of palpitations, chest pain, shortness of breath, orthopnea, PND, lower extremity edema, claudication, dizziness, presyncope, syncope, bleeding, or neurologic sequela. The patient is tolerating medications without difficulties and is otherwise without complaint today.    Past Medical History  Diagnosis Date  . Diabetes mellitus   . Pneumonia     Hospitalized 03/10/2011  . Atrial fibrillation     permanent afib  . Hypertension   . Chest pain 2001    nonobstructive dz by LHC in 2001, recent echocardiogram 2012 ejection fraction 65-70%   . Complete heart block     Engineer, maintenance  . Pulmonary hypertension     PA pressures of 77 mmHg December 2012   Past Surgical History  Procedure Laterality Date  . Abdominal hysterectomy    . Breast surgery      60 yrs ago after childbirth breast lanced  . Eye surgery      Cataract Sx   . Pacemaker insertion  11/12    [employer] PPM implanted by Dr Lovena Le  . Permanent pacemaker insertion N/A 02/08/2011    Procedure: PERMANENT PACEMAKER INSERTION;  Surgeon: Evans Lance, MD;  Location: [facility] CATH LAB;  Service: Cardiovascular;  Laterality: N/A;     Current Outpatient Prescriptions  Medication Sig Dispense Refill  . aspirin EC 81 MG tablet Take 1  tablet (81 mg total) by mouth daily.    Marland Kitchen b complex vitamins tablet Take 1 tablet by mouth daily.      . Calcium Carbonate-Vitamin D (CALCIUM + D PO) Take 1 tablet by mouth daily.     Marland Kitchen docusate sodium (COLACE) 100 MG capsule Take 100 mg by mouth every other day.     . donepezil (ARICEPT) 5 MG tablet Take 5 mg by mouth at bedtime.     Marland Kitchen esomeprazole (NEXIUM) 40 MG capsule Take 40 mg by mouth daily before breakfast.      . glipiZIDE (GLUCOTROL XL) 5 MG 24 hr tablet Take 5 mg by mouth daily.    . hydrochlorothiazide (HYDRODIURIL) 25 MG tablet Take 25 mg by mouth daily.    Marland Kitchen lisinopril (PRINIVIL,ZESTRIL) 10 MG tablet TAKE (1) TABLET BY MOUTH ONCE DAILY. 90 tablet 2  . loratadine (CLARITIN) 10 MG tablet Take 10 mg by mouth daily.      . Multiple Vitamin (MULTIVITAMIN) capsule Take 1 capsule by mouth daily.      . nitrofurantoin (MACRODANTIN) 50 MG capsule Take 50 mg by mouth daily.    . nitroGLYCERIN (NITROSTAT) 0.4 MG SL tablet Place 1 tablet (0.4 mg total) under the tongue every 5 (five) minutes as needed for chest pain. 25 tablet 3  . OVER THE COUNTER MEDICATION Take 1 tablet by mouth daily. Magnesium/Zinc 400/15  No current facility-administered medications for this visit.    Allergies:   Review of patient's allergies indicates no known allergies.   Social History:  The patient  reports that she has never smoked. She has never used smokeless tobacco. She reports that she does not drink alcohol or use illicit drugs.   Family History:  The patient's family history includes Coronary artery disease in her brother; Diabetes in her father.    ROS:  Please see the history of present illness.   All other systems are reviewed and negative.    PHYSICAL EXAM: VS:  BP 135/73 mmHg  Pulse 61  Ht 5\' 5"  (1.651 m)  Wt 146 lb 12.8 oz (66.588 kg)  BMI 24.43 kg/m2  SpO2 95% , BMI Body mass index is 24.43 kg/(m^2). GEN: Well nourished, well developed, in no acute distress HEENT: normal Neck: no  JVD, carotid bruits, or masses Cardiac: RRR; no murmurs, rubs, or gallops,no edema  Respiratory:  clear to auscultation bilaterally, normal work of breathing GI: soft, nontender, nondistended, + BS MS: no deformity or atrophy Skin: warm and dry, device pocket is well healed Neuro:  Strength and sensation are intact Psych: euthymic mood, full affect  Device interrogation is reviewed today in detail.  See PaceArt for details.   Recent Labs: No results found for requested labs within last 365 days.    Lipid Panel     Component Value Date/Time   CHOL 132 02/05/2011 0556   TRIG 33 02/05/2011 0556   HDL 71 02/05/2011 0556   CHOLHDL 1.9 02/05/2011 0556   VLDL 7 02/05/2011 0556   LDLCALC 54 02/05/2011 0556     Wt Readings from Last 3 Encounters:  05/31/14 146 lb 12.8 oz (66.588 kg)  09/24/13 141 lb (63.957 kg)  03/01/13 147 lb (66.679 kg)      Other studies Reviewed: Additional studies/ records that were reviewed today include: Dr Raylene Everts note   ASSESSMENT AND PLAN:  1. Complete heart block Normal pacemaker function See Pace Art report No changes today Declines remote monitoring  2. Permanent afib Her CHADS2VASC score is at least 5. She should be anticoagulated long term.  She is clear in her decision to decline anticoagulation.  3. HTN/ CAD Stable No change required today   Return to see me in 1 year    Current medicines are reviewed at length with the patient today.   The patient does not have concerns regarding her medicines.  The following changes were made today:  none  Labs/ tests ordered today include:  Orders Placed This Encounter  Procedures  . Implantable device check     Signed, Thompson Grayer, MD  05/31/2014 11:30 AM     [facility] [address] [place] [address] [phone] (office) [phone] (fax)

## 2014-05-31 NOTE — Patient Instructions (Signed)
Your physician recommends that you schedule a follow-up appointment in: 1 year with Dr. Rayann Heman. You will receive a reminder letter in the mail in about 10 months reminding you to call and schedule your appointment. If you don't receive this letter, please contact our office. Your physician recommends that you continue on your current medications as directed. Please refer to the Current Medication list given to you today.

## 2014-06-11 ENCOUNTER — Encounter: Payer: Self-pay | Admitting: Internal Medicine

## 2014-06-11 DIAGNOSIS — C44310 Basal cell carcinoma of skin of unspecified parts of face: Secondary | ICD-10-CM | POA: Diagnosis not present

## 2014-06-12 DIAGNOSIS — Z85828 Personal history of other malignant neoplasm of skin: Secondary | ICD-10-CM | POA: Diagnosis not present

## 2014-06-12 DIAGNOSIS — E119 Type 2 diabetes mellitus without complications: Secondary | ICD-10-CM | POA: Diagnosis not present

## 2014-06-12 DIAGNOSIS — K219 Gastro-esophageal reflux disease without esophagitis: Secondary | ICD-10-CM | POA: Diagnosis not present

## 2014-06-12 DIAGNOSIS — Z950 Presence of cardiac pacemaker: Secondary | ICD-10-CM | POA: Diagnosis not present

## 2014-06-12 DIAGNOSIS — Z833 Family history of diabetes mellitus: Secondary | ICD-10-CM | POA: Diagnosis not present

## 2014-06-12 DIAGNOSIS — I10 Essential (primary) hypertension: Secondary | ICD-10-CM | POA: Diagnosis not present

## 2014-06-12 DIAGNOSIS — Z79899 Other long term (current) drug therapy: Secondary | ICD-10-CM | POA: Diagnosis not present

## 2014-06-12 DIAGNOSIS — Z7982 Long term (current) use of aspirin: Secondary | ICD-10-CM | POA: Diagnosis not present

## 2014-06-12 DIAGNOSIS — G309 Alzheimer's disease, unspecified: Secondary | ICD-10-CM | POA: Diagnosis not present

## 2014-06-12 DIAGNOSIS — C4440 Unspecified malignant neoplasm of skin of scalp and neck: Secondary | ICD-10-CM | POA: Diagnosis not present

## 2014-06-12 DIAGNOSIS — C44300 Unspecified malignant neoplasm of skin of unspecified part of face: Secondary | ICD-10-CM | POA: Diagnosis not present

## 2014-06-12 DIAGNOSIS — F0280 Dementia in other diseases classified elsewhere without behavioral disturbance: Secondary | ICD-10-CM | POA: Diagnosis not present

## 2014-06-12 DIAGNOSIS — G4700 Insomnia, unspecified: Secondary | ICD-10-CM | POA: Diagnosis not present

## 2014-06-12 DIAGNOSIS — C44310 Basal cell carcinoma of skin of unspecified parts of face: Secondary | ICD-10-CM | POA: Diagnosis not present

## 2014-11-22 DIAGNOSIS — E119 Type 2 diabetes mellitus without complications: Secondary | ICD-10-CM | POA: Diagnosis not present

## 2014-11-22 DIAGNOSIS — S81801A Unspecified open wound, right lower leg, initial encounter: Secondary | ICD-10-CM | POA: Diagnosis not present

## 2014-11-27 DIAGNOSIS — E119 Type 2 diabetes mellitus without complications: Secondary | ICD-10-CM | POA: Diagnosis not present

## 2014-11-27 DIAGNOSIS — S81801D Unspecified open wound, right lower leg, subsequent encounter: Secondary | ICD-10-CM | POA: Diagnosis not present

## 2014-12-20 DIAGNOSIS — G309 Alzheimer's disease, unspecified: Secondary | ICD-10-CM | POA: Diagnosis not present

## 2014-12-20 DIAGNOSIS — K559 Vascular disorder of intestine, unspecified: Secondary | ICD-10-CM | POA: Diagnosis not present

## 2014-12-20 DIAGNOSIS — I10 Essential (primary) hypertension: Secondary | ICD-10-CM | POA: Diagnosis not present

## 2014-12-20 DIAGNOSIS — E119 Type 2 diabetes mellitus without complications: Secondary | ICD-10-CM | POA: Diagnosis not present

## 2014-12-27 DIAGNOSIS — Z23 Encounter for immunization: Secondary | ICD-10-CM | POA: Diagnosis not present

## 2014-12-27 DIAGNOSIS — H35363 Drusen (degenerative) of macula, bilateral: Secondary | ICD-10-CM | POA: Diagnosis not present

## 2014-12-27 DIAGNOSIS — Z1389 Encounter for screening for other disorder: Secondary | ICD-10-CM | POA: Diagnosis not present

## 2014-12-27 DIAGNOSIS — Z0001 Encounter for general adult medical examination with abnormal findings: Secondary | ICD-10-CM | POA: Diagnosis not present

## 2015-02-10 DIAGNOSIS — R10827 Generalized rebound abdominal tenderness: Secondary | ICD-10-CM | POA: Diagnosis not present

## 2015-02-10 DIAGNOSIS — R197 Diarrhea, unspecified: Secondary | ICD-10-CM | POA: Diagnosis not present

## 2015-02-10 DIAGNOSIS — R1030 Lower abdominal pain, unspecified: Secondary | ICD-10-CM | POA: Diagnosis not present

## 2015-02-10 DIAGNOSIS — I10 Essential (primary) hypertension: Secondary | ICD-10-CM | POA: Diagnosis not present

## 2015-02-10 DIAGNOSIS — K219 Gastro-esophageal reflux disease without esophagitis: Secondary | ICD-10-CM | POA: Diagnosis not present

## 2015-02-10 DIAGNOSIS — E119 Type 2 diabetes mellitus without complications: Secondary | ICD-10-CM | POA: Diagnosis not present

## 2015-02-10 DIAGNOSIS — Z950 Presence of cardiac pacemaker: Secondary | ICD-10-CM | POA: Diagnosis not present

## 2015-02-10 DIAGNOSIS — Z79899 Other long term (current) drug therapy: Secondary | ICD-10-CM | POA: Diagnosis not present

## 2015-02-10 DIAGNOSIS — K529 Noninfective gastroenteritis and colitis, unspecified: Secondary | ICD-10-CM | POA: Diagnosis not present

## 2015-02-10 DIAGNOSIS — Z7982 Long term (current) use of aspirin: Secondary | ICD-10-CM | POA: Diagnosis not present

## 2015-02-21 DIAGNOSIS — K529 Noninfective gastroenteritis and colitis, unspecified: Secondary | ICD-10-CM | POA: Diagnosis not present

## 2015-02-21 DIAGNOSIS — R197 Diarrhea, unspecified: Secondary | ICD-10-CM | POA: Diagnosis not present

## 2015-02-24 DIAGNOSIS — K219 Gastro-esophageal reflux disease without esophagitis: Secondary | ICD-10-CM | POA: Diagnosis not present

## 2015-02-24 DIAGNOSIS — R109 Unspecified abdominal pain: Secondary | ICD-10-CM | POA: Diagnosis not present

## 2015-02-24 DIAGNOSIS — Z90710 Acquired absence of both cervix and uterus: Secondary | ICD-10-CM | POA: Diagnosis not present

## 2015-02-24 DIAGNOSIS — E119 Type 2 diabetes mellitus without complications: Secondary | ICD-10-CM | POA: Diagnosis not present

## 2015-02-24 DIAGNOSIS — Z7982 Long term (current) use of aspirin: Secondary | ICD-10-CM | POA: Diagnosis not present

## 2015-02-24 DIAGNOSIS — Z7984 Long term (current) use of oral hypoglycemic drugs: Secondary | ICD-10-CM | POA: Diagnosis not present

## 2015-02-24 DIAGNOSIS — Z79899 Other long term (current) drug therapy: Secondary | ICD-10-CM | POA: Diagnosis not present

## 2015-02-24 DIAGNOSIS — Z950 Presence of cardiac pacemaker: Secondary | ICD-10-CM | POA: Diagnosis not present

## 2015-02-24 DIAGNOSIS — R1031 Right lower quadrant pain: Secondary | ICD-10-CM | POA: Diagnosis not present

## 2015-02-24 DIAGNOSIS — R1032 Left lower quadrant pain: Secondary | ICD-10-CM | POA: Diagnosis not present

## 2015-06-25 DIAGNOSIS — R197 Diarrhea, unspecified: Secondary | ICD-10-CM | POA: Diagnosis not present

## 2015-06-25 DIAGNOSIS — E119 Type 2 diabetes mellitus without complications: Secondary | ICD-10-CM | POA: Diagnosis not present

## 2015-08-04 ENCOUNTER — Other Ambulatory Visit: Payer: Self-pay | Admitting: Internal Medicine

## 2015-09-24 DIAGNOSIS — G309 Alzheimer's disease, unspecified: Secondary | ICD-10-CM | POA: Diagnosis not present

## 2015-09-24 DIAGNOSIS — I10 Essential (primary) hypertension: Secondary | ICD-10-CM | POA: Diagnosis not present

## 2015-09-24 DIAGNOSIS — K529 Noninfective gastroenteritis and colitis, unspecified: Secondary | ICD-10-CM | POA: Diagnosis not present

## 2015-10-01 DIAGNOSIS — R2689 Other abnormalities of gait and mobility: Secondary | ICD-10-CM | POA: Diagnosis not present

## 2015-10-01 DIAGNOSIS — M6281 Muscle weakness (generalized): Secondary | ICD-10-CM | POA: Diagnosis not present

## 2015-10-01 DIAGNOSIS — E119 Type 2 diabetes mellitus without complications: Secondary | ICD-10-CM | POA: Diagnosis not present

## 2015-10-01 DIAGNOSIS — G309 Alzheimer's disease, unspecified: Secondary | ICD-10-CM | POA: Diagnosis not present

## 2015-10-15 DIAGNOSIS — E119 Type 2 diabetes mellitus without complications: Secondary | ICD-10-CM | POA: Diagnosis not present

## 2015-10-15 DIAGNOSIS — Z792 Long term (current) use of antibiotics: Secondary | ICD-10-CM | POA: Diagnosis not present

## 2015-10-15 DIAGNOSIS — R2689 Other abnormalities of gait and mobility: Secondary | ICD-10-CM | POA: Diagnosis not present

## 2015-10-15 DIAGNOSIS — G309 Alzheimer's disease, unspecified: Secondary | ICD-10-CM | POA: Diagnosis not present

## 2015-10-15 DIAGNOSIS — I10 Essential (primary) hypertension: Secondary | ICD-10-CM | POA: Diagnosis not present

## 2015-10-15 DIAGNOSIS — M6281 Muscle weakness (generalized): Secondary | ICD-10-CM | POA: Diagnosis not present

## 2015-10-15 DIAGNOSIS — Z7984 Long term (current) use of oral hypoglycemic drugs: Secondary | ICD-10-CM | POA: Diagnosis not present

## 2015-10-15 DIAGNOSIS — Z7982 Long term (current) use of aspirin: Secondary | ICD-10-CM | POA: Diagnosis not present

## 2015-10-17 DIAGNOSIS — Z7984 Long term (current) use of oral hypoglycemic drugs: Secondary | ICD-10-CM | POA: Diagnosis not present

## 2015-10-17 DIAGNOSIS — M6281 Muscle weakness (generalized): Secondary | ICD-10-CM | POA: Diagnosis not present

## 2015-10-17 DIAGNOSIS — Z792 Long term (current) use of antibiotics: Secondary | ICD-10-CM | POA: Diagnosis not present

## 2015-10-17 DIAGNOSIS — Z7982 Long term (current) use of aspirin: Secondary | ICD-10-CM | POA: Diagnosis not present

## 2015-10-17 DIAGNOSIS — E119 Type 2 diabetes mellitus without complications: Secondary | ICD-10-CM | POA: Diagnosis not present

## 2015-10-17 DIAGNOSIS — R2689 Other abnormalities of gait and mobility: Secondary | ICD-10-CM | POA: Diagnosis not present

## 2015-10-17 DIAGNOSIS — G309 Alzheimer's disease, unspecified: Secondary | ICD-10-CM | POA: Diagnosis not present

## 2015-10-17 DIAGNOSIS — I10 Essential (primary) hypertension: Secondary | ICD-10-CM | POA: Diagnosis not present

## 2015-10-21 DIAGNOSIS — Z7982 Long term (current) use of aspirin: Secondary | ICD-10-CM | POA: Diagnosis not present

## 2015-10-21 DIAGNOSIS — E119 Type 2 diabetes mellitus without complications: Secondary | ICD-10-CM | POA: Diagnosis not present

## 2015-10-21 DIAGNOSIS — R2689 Other abnormalities of gait and mobility: Secondary | ICD-10-CM | POA: Diagnosis not present

## 2015-10-21 DIAGNOSIS — Z792 Long term (current) use of antibiotics: Secondary | ICD-10-CM | POA: Diagnosis not present

## 2015-10-21 DIAGNOSIS — Z7984 Long term (current) use of oral hypoglycemic drugs: Secondary | ICD-10-CM | POA: Diagnosis not present

## 2015-10-21 DIAGNOSIS — I10 Essential (primary) hypertension: Secondary | ICD-10-CM | POA: Diagnosis not present

## 2015-10-21 DIAGNOSIS — G309 Alzheimer's disease, unspecified: Secondary | ICD-10-CM | POA: Diagnosis not present

## 2015-10-21 DIAGNOSIS — M6281 Muscle weakness (generalized): Secondary | ICD-10-CM | POA: Diagnosis not present

## 2015-10-23 DIAGNOSIS — R2689 Other abnormalities of gait and mobility: Secondary | ICD-10-CM | POA: Diagnosis not present

## 2015-10-23 DIAGNOSIS — Z7984 Long term (current) use of oral hypoglycemic drugs: Secondary | ICD-10-CM | POA: Diagnosis not present

## 2015-10-23 DIAGNOSIS — G309 Alzheimer's disease, unspecified: Secondary | ICD-10-CM | POA: Diagnosis not present

## 2015-10-23 DIAGNOSIS — I10 Essential (primary) hypertension: Secondary | ICD-10-CM | POA: Diagnosis not present

## 2015-10-23 DIAGNOSIS — Z792 Long term (current) use of antibiotics: Secondary | ICD-10-CM | POA: Diagnosis not present

## 2015-10-23 DIAGNOSIS — E119 Type 2 diabetes mellitus without complications: Secondary | ICD-10-CM | POA: Diagnosis not present

## 2015-10-23 DIAGNOSIS — Z7982 Long term (current) use of aspirin: Secondary | ICD-10-CM | POA: Diagnosis not present

## 2015-10-23 DIAGNOSIS — M6281 Muscle weakness (generalized): Secondary | ICD-10-CM | POA: Diagnosis not present

## 2015-10-28 DIAGNOSIS — R2689 Other abnormalities of gait and mobility: Secondary | ICD-10-CM | POA: Diagnosis not present

## 2015-10-28 DIAGNOSIS — G309 Alzheimer's disease, unspecified: Secondary | ICD-10-CM | POA: Diagnosis not present

## 2015-10-28 DIAGNOSIS — I10 Essential (primary) hypertension: Secondary | ICD-10-CM | POA: Diagnosis not present

## 2015-10-28 DIAGNOSIS — M6281 Muscle weakness (generalized): Secondary | ICD-10-CM | POA: Diagnosis not present

## 2015-10-28 DIAGNOSIS — Z7982 Long term (current) use of aspirin: Secondary | ICD-10-CM | POA: Diagnosis not present

## 2015-10-28 DIAGNOSIS — Z792 Long term (current) use of antibiotics: Secondary | ICD-10-CM | POA: Diagnosis not present

## 2015-10-28 DIAGNOSIS — Z7984 Long term (current) use of oral hypoglycemic drugs: Secondary | ICD-10-CM | POA: Diagnosis not present

## 2015-10-28 DIAGNOSIS — E119 Type 2 diabetes mellitus without complications: Secondary | ICD-10-CM | POA: Diagnosis not present

## 2015-10-30 DIAGNOSIS — I10 Essential (primary) hypertension: Secondary | ICD-10-CM | POA: Diagnosis not present

## 2015-10-30 DIAGNOSIS — E119 Type 2 diabetes mellitus without complications: Secondary | ICD-10-CM | POA: Diagnosis not present

## 2015-10-30 DIAGNOSIS — Z7982 Long term (current) use of aspirin: Secondary | ICD-10-CM | POA: Diagnosis not present

## 2015-10-30 DIAGNOSIS — G309 Alzheimer's disease, unspecified: Secondary | ICD-10-CM | POA: Diagnosis not present

## 2015-10-30 DIAGNOSIS — Z7984 Long term (current) use of oral hypoglycemic drugs: Secondary | ICD-10-CM | POA: Diagnosis not present

## 2015-10-30 DIAGNOSIS — Z792 Long term (current) use of antibiotics: Secondary | ICD-10-CM | POA: Diagnosis not present

## 2015-10-30 DIAGNOSIS — R2689 Other abnormalities of gait and mobility: Secondary | ICD-10-CM | POA: Diagnosis not present

## 2015-10-30 DIAGNOSIS — M6281 Muscle weakness (generalized): Secondary | ICD-10-CM | POA: Diagnosis not present

## 2015-11-04 DIAGNOSIS — Z7982 Long term (current) use of aspirin: Secondary | ICD-10-CM | POA: Diagnosis not present

## 2015-11-04 DIAGNOSIS — Z792 Long term (current) use of antibiotics: Secondary | ICD-10-CM | POA: Diagnosis not present

## 2015-11-04 DIAGNOSIS — I10 Essential (primary) hypertension: Secondary | ICD-10-CM | POA: Diagnosis not present

## 2015-11-04 DIAGNOSIS — M6281 Muscle weakness (generalized): Secondary | ICD-10-CM | POA: Diagnosis not present

## 2015-11-04 DIAGNOSIS — Z7984 Long term (current) use of oral hypoglycemic drugs: Secondary | ICD-10-CM | POA: Diagnosis not present

## 2015-11-04 DIAGNOSIS — R2689 Other abnormalities of gait and mobility: Secondary | ICD-10-CM | POA: Diagnosis not present

## 2015-11-04 DIAGNOSIS — G309 Alzheimer's disease, unspecified: Secondary | ICD-10-CM | POA: Diagnosis not present

## 2015-11-04 DIAGNOSIS — E119 Type 2 diabetes mellitus without complications: Secondary | ICD-10-CM | POA: Diagnosis not present

## 2015-11-06 DIAGNOSIS — Z7984 Long term (current) use of oral hypoglycemic drugs: Secondary | ICD-10-CM | POA: Diagnosis not present

## 2015-11-06 DIAGNOSIS — M6281 Muscle weakness (generalized): Secondary | ICD-10-CM | POA: Diagnosis not present

## 2015-11-06 DIAGNOSIS — E119 Type 2 diabetes mellitus without complications: Secondary | ICD-10-CM | POA: Diagnosis not present

## 2015-11-06 DIAGNOSIS — R2689 Other abnormalities of gait and mobility: Secondary | ICD-10-CM | POA: Diagnosis not present

## 2015-11-06 DIAGNOSIS — Z792 Long term (current) use of antibiotics: Secondary | ICD-10-CM | POA: Diagnosis not present

## 2015-11-06 DIAGNOSIS — Z7982 Long term (current) use of aspirin: Secondary | ICD-10-CM | POA: Diagnosis not present

## 2015-11-06 DIAGNOSIS — G309 Alzheimer's disease, unspecified: Secondary | ICD-10-CM | POA: Diagnosis not present

## 2015-11-06 DIAGNOSIS — I10 Essential (primary) hypertension: Secondary | ICD-10-CM | POA: Diagnosis not present

## 2015-11-10 DIAGNOSIS — M6281 Muscle weakness (generalized): Secondary | ICD-10-CM | POA: Diagnosis not present

## 2015-11-10 DIAGNOSIS — E119 Type 2 diabetes mellitus without complications: Secondary | ICD-10-CM | POA: Diagnosis not present

## 2015-11-10 DIAGNOSIS — Z7984 Long term (current) use of oral hypoglycemic drugs: Secondary | ICD-10-CM | POA: Diagnosis not present

## 2015-11-10 DIAGNOSIS — I10 Essential (primary) hypertension: Secondary | ICD-10-CM | POA: Diagnosis not present

## 2015-11-10 DIAGNOSIS — Z792 Long term (current) use of antibiotics: Secondary | ICD-10-CM | POA: Diagnosis not present

## 2015-11-10 DIAGNOSIS — R2689 Other abnormalities of gait and mobility: Secondary | ICD-10-CM | POA: Diagnosis not present

## 2015-11-10 DIAGNOSIS — G309 Alzheimer's disease, unspecified: Secondary | ICD-10-CM | POA: Diagnosis not present

## 2015-11-10 DIAGNOSIS — Z7982 Long term (current) use of aspirin: Secondary | ICD-10-CM | POA: Diagnosis not present

## 2015-11-13 DIAGNOSIS — I10 Essential (primary) hypertension: Secondary | ICD-10-CM | POA: Diagnosis not present

## 2015-11-13 DIAGNOSIS — Z792 Long term (current) use of antibiotics: Secondary | ICD-10-CM | POA: Diagnosis not present

## 2015-11-13 DIAGNOSIS — Z7984 Long term (current) use of oral hypoglycemic drugs: Secondary | ICD-10-CM | POA: Diagnosis not present

## 2015-11-13 DIAGNOSIS — R2689 Other abnormalities of gait and mobility: Secondary | ICD-10-CM | POA: Diagnosis not present

## 2015-11-13 DIAGNOSIS — M6281 Muscle weakness (generalized): Secondary | ICD-10-CM | POA: Diagnosis not present

## 2015-11-13 DIAGNOSIS — E119 Type 2 diabetes mellitus without complications: Secondary | ICD-10-CM | POA: Diagnosis not present

## 2015-11-13 DIAGNOSIS — Z7982 Long term (current) use of aspirin: Secondary | ICD-10-CM | POA: Diagnosis not present

## 2015-11-13 DIAGNOSIS — G309 Alzheimer's disease, unspecified: Secondary | ICD-10-CM | POA: Diagnosis not present

## 2015-11-18 DIAGNOSIS — M6281 Muscle weakness (generalized): Secondary | ICD-10-CM | POA: Diagnosis not present

## 2015-11-18 DIAGNOSIS — Z792 Long term (current) use of antibiotics: Secondary | ICD-10-CM | POA: Diagnosis not present

## 2015-11-18 DIAGNOSIS — G309 Alzheimer's disease, unspecified: Secondary | ICD-10-CM | POA: Diagnosis not present

## 2015-11-18 DIAGNOSIS — R2689 Other abnormalities of gait and mobility: Secondary | ICD-10-CM | POA: Diagnosis not present

## 2015-11-18 DIAGNOSIS — Z7982 Long term (current) use of aspirin: Secondary | ICD-10-CM | POA: Diagnosis not present

## 2015-11-18 DIAGNOSIS — Z7984 Long term (current) use of oral hypoglycemic drugs: Secondary | ICD-10-CM | POA: Diagnosis not present

## 2015-11-18 DIAGNOSIS — I10 Essential (primary) hypertension: Secondary | ICD-10-CM | POA: Diagnosis not present

## 2015-11-18 DIAGNOSIS — E119 Type 2 diabetes mellitus without complications: Secondary | ICD-10-CM | POA: Diagnosis not present

## 2015-11-20 DIAGNOSIS — M6281 Muscle weakness (generalized): Secondary | ICD-10-CM | POA: Diagnosis not present

## 2015-11-20 DIAGNOSIS — I10 Essential (primary) hypertension: Secondary | ICD-10-CM | POA: Diagnosis not present

## 2015-11-20 DIAGNOSIS — E119 Type 2 diabetes mellitus without complications: Secondary | ICD-10-CM | POA: Diagnosis not present

## 2015-11-20 DIAGNOSIS — Z7984 Long term (current) use of oral hypoglycemic drugs: Secondary | ICD-10-CM | POA: Diagnosis not present

## 2015-11-20 DIAGNOSIS — G309 Alzheimer's disease, unspecified: Secondary | ICD-10-CM | POA: Diagnosis not present

## 2015-11-20 DIAGNOSIS — R2689 Other abnormalities of gait and mobility: Secondary | ICD-10-CM | POA: Diagnosis not present

## 2015-11-20 DIAGNOSIS — Z7982 Long term (current) use of aspirin: Secondary | ICD-10-CM | POA: Diagnosis not present

## 2015-11-20 DIAGNOSIS — Z792 Long term (current) use of antibiotics: Secondary | ICD-10-CM | POA: Diagnosis not present

## 2015-12-26 DIAGNOSIS — H3530 Unspecified macular degeneration: Secondary | ICD-10-CM | POA: Diagnosis not present

## 2015-12-26 DIAGNOSIS — R278 Other lack of coordination: Secondary | ICD-10-CM | POA: Diagnosis not present

## 2015-12-26 DIAGNOSIS — I10 Essential (primary) hypertension: Secondary | ICD-10-CM | POA: Diagnosis not present

## 2015-12-26 DIAGNOSIS — K219 Gastro-esophageal reflux disease without esophagitis: Secondary | ICD-10-CM | POA: Diagnosis not present

## 2015-12-26 DIAGNOSIS — Z23 Encounter for immunization: Secondary | ICD-10-CM | POA: Diagnosis not present

## 2015-12-26 DIAGNOSIS — Z950 Presence of cardiac pacemaker: Secondary | ICD-10-CM | POA: Diagnosis not present

## 2015-12-26 DIAGNOSIS — L570 Actinic keratosis: Secondary | ICD-10-CM | POA: Diagnosis not present

## 2015-12-26 DIAGNOSIS — G309 Alzheimer's disease, unspecified: Secondary | ICD-10-CM | POA: Diagnosis not present

## 2015-12-26 DIAGNOSIS — E119 Type 2 diabetes mellitus without complications: Secondary | ICD-10-CM | POA: Diagnosis not present

## 2015-12-26 DIAGNOSIS — Z881 Allergy status to other antibiotic agents status: Secondary | ICD-10-CM | POA: Diagnosis not present

## 2015-12-26 DIAGNOSIS — M6281 Muscle weakness (generalized): Secondary | ICD-10-CM | POA: Diagnosis not present

## 2016-01-07 DIAGNOSIS — Z23 Encounter for immunization: Secondary | ICD-10-CM | POA: Diagnosis not present

## 2016-01-22 DIAGNOSIS — I739 Peripheral vascular disease, unspecified: Secondary | ICD-10-CM | POA: Diagnosis not present

## 2016-01-22 DIAGNOSIS — E119 Type 2 diabetes mellitus without complications: Secondary | ICD-10-CM | POA: Diagnosis not present

## 2016-01-22 DIAGNOSIS — B351 Tinea unguium: Secondary | ICD-10-CM | POA: Diagnosis not present

## 2016-01-22 DIAGNOSIS — M79674 Pain in right toe(s): Secondary | ICD-10-CM | POA: Diagnosis not present

## 2016-03-03 DIAGNOSIS — Z7984 Long term (current) use of oral hypoglycemic drugs: Secondary | ICD-10-CM | POA: Diagnosis not present

## 2016-03-03 DIAGNOSIS — Z5181 Encounter for therapeutic drug level monitoring: Secondary | ICD-10-CM | POA: Diagnosis not present

## 2016-03-03 DIAGNOSIS — Z79899 Other long term (current) drug therapy: Secondary | ICD-10-CM | POA: Diagnosis not present

## 2016-03-03 DIAGNOSIS — Z7982 Long term (current) use of aspirin: Secondary | ICD-10-CM | POA: Diagnosis not present

## 2016-03-26 DIAGNOSIS — R05 Cough: Secondary | ICD-10-CM | POA: Diagnosis not present

## 2016-05-06 DIAGNOSIS — M6281 Muscle weakness (generalized): Secondary | ICD-10-CM | POA: Diagnosis not present

## 2016-05-06 DIAGNOSIS — R278 Other lack of coordination: Secondary | ICD-10-CM | POA: Diagnosis not present

## 2016-05-06 DIAGNOSIS — R2689 Other abnormalities of gait and mobility: Secondary | ICD-10-CM | POA: Diagnosis not present

## 2016-05-07 DIAGNOSIS — R278 Other lack of coordination: Secondary | ICD-10-CM | POA: Diagnosis not present

## 2016-05-07 DIAGNOSIS — R2689 Other abnormalities of gait and mobility: Secondary | ICD-10-CM | POA: Diagnosis not present

## 2016-05-07 DIAGNOSIS — M6281 Muscle weakness (generalized): Secondary | ICD-10-CM | POA: Diagnosis not present

## 2016-05-10 DIAGNOSIS — R278 Other lack of coordination: Secondary | ICD-10-CM | POA: Diagnosis not present

## 2016-05-10 DIAGNOSIS — R2689 Other abnormalities of gait and mobility: Secondary | ICD-10-CM | POA: Diagnosis not present

## 2016-05-10 DIAGNOSIS — M6281 Muscle weakness (generalized): Secondary | ICD-10-CM | POA: Diagnosis not present

## 2016-05-11 DIAGNOSIS — R278 Other lack of coordination: Secondary | ICD-10-CM | POA: Diagnosis not present

## 2016-05-11 DIAGNOSIS — M6281 Muscle weakness (generalized): Secondary | ICD-10-CM | POA: Diagnosis not present

## 2016-05-11 DIAGNOSIS — R2689 Other abnormalities of gait and mobility: Secondary | ICD-10-CM | POA: Diagnosis not present

## 2016-05-12 DIAGNOSIS — R2689 Other abnormalities of gait and mobility: Secondary | ICD-10-CM | POA: Diagnosis not present

## 2016-05-12 DIAGNOSIS — M6281 Muscle weakness (generalized): Secondary | ICD-10-CM | POA: Diagnosis not present

## 2016-05-12 DIAGNOSIS — R278 Other lack of coordination: Secondary | ICD-10-CM | POA: Diagnosis not present

## 2016-05-13 DIAGNOSIS — I10 Essential (primary) hypertension: Secondary | ICD-10-CM | POA: Diagnosis not present

## 2016-05-13 DIAGNOSIS — R278 Other lack of coordination: Secondary | ICD-10-CM | POA: Diagnosis not present

## 2016-05-13 DIAGNOSIS — M6281 Muscle weakness (generalized): Secondary | ICD-10-CM | POA: Diagnosis not present

## 2016-05-13 DIAGNOSIS — R2689 Other abnormalities of gait and mobility: Secondary | ICD-10-CM | POA: Diagnosis not present

## 2016-05-14 ENCOUNTER — Ambulatory Visit (INDEPENDENT_AMBULATORY_CARE_PROVIDER_SITE_OTHER): Payer: Medicare Other | Admitting: Cardiovascular Disease

## 2016-05-14 ENCOUNTER — Encounter: Payer: Self-pay | Admitting: Cardiovascular Disease

## 2016-05-14 VITALS — BP 110/60 | HR 66 | Ht 67.0 in | Wt 149.0 lb

## 2016-05-14 DIAGNOSIS — Z950 Presence of cardiac pacemaker: Secondary | ICD-10-CM | POA: Diagnosis not present

## 2016-05-14 DIAGNOSIS — R278 Other lack of coordination: Secondary | ICD-10-CM | POA: Diagnosis not present

## 2016-05-14 DIAGNOSIS — I482 Chronic atrial fibrillation, unspecified: Secondary | ICD-10-CM

## 2016-05-14 DIAGNOSIS — I25118 Atherosclerotic heart disease of native coronary artery with other forms of angina pectoris: Secondary | ICD-10-CM

## 2016-05-14 DIAGNOSIS — R079 Chest pain, unspecified: Secondary | ICD-10-CM | POA: Diagnosis not present

## 2016-05-14 DIAGNOSIS — I10 Essential (primary) hypertension: Secondary | ICD-10-CM

## 2016-05-14 DIAGNOSIS — M6281 Muscle weakness (generalized): Secondary | ICD-10-CM | POA: Diagnosis not present

## 2016-05-14 DIAGNOSIS — R2689 Other abnormalities of gait and mobility: Secondary | ICD-10-CM | POA: Diagnosis not present

## 2016-05-14 MED ORDER — ISOSORBIDE MONONITRATE ER 30 MG PO TB24: 30.0000 mg | ORAL_TABLET | Freq: Every day | ORAL | 3 refills | Status: DC

## 2016-05-14 NOTE — Progress Notes (Signed)
SUBJECTIVE: The patient presents for overdue follow-up. I last saw her in June 2015. She has a pacemaker for tachycardia-bradycardia syndrome, atrial fibrillation, essential hypertension, pulmonary hypertension, and a history of nonobstructive coronary artery disease.  She had been prescribed Eliquis by Dr. Rayann Heman, but after a long discussion with family members which includes two nurses, the decision was made to not take any anticoagulants whatsoever. She does take a baby aspirin daily.  She has been having episodic chest pains with radiation down her left arm. She has taken nitroglycerin with relief. She has had exertional dyspnea for 6 months.  ECG performed in the office today shows ventricular paced rhythm.   She is due to have her pacemaker checked on April 13.   Review of Systems: As per "subjective", otherwise negative.  No Known Allergies  Current Outpatient Prescriptions  Medication Sig Dispense Refill  . aspirin EC 81 MG tablet Take 1 tablet (81 mg total) by mouth daily.    Marland Kitchen b complex vitamins tablet Take 1 tablet by mouth daily.      . Calcium Carbonate-Vitamin D (CALCIUM + D PO) Take 1 tablet by mouth daily.     Marland Kitchen docusate sodium (COLACE) 100 MG capsule Take 100 mg by mouth every other day.     . donepezil (ARICEPT) 5 MG tablet Take 5 mg by mouth at bedtime.     Marland Kitchen esomeprazole (NEXIUM) 40 MG capsule Take 40 mg by mouth daily before breakfast.      . glipiZIDE (GLUCOTROL XL) 5 MG 24 hr tablet Take 5 mg by mouth daily.    . hydrochlorothiazide (HYDRODIURIL) 25 MG tablet Take 25 mg by mouth daily.    Marland Kitchen lisinopril (PRINIVIL,ZESTRIL) 10 MG tablet TAKE (1) TABLET BY MOUTH ONCE DAILY. 90 tablet 2  . loratadine (CLARITIN) 10 MG tablet Take 10 mg by mouth daily.      . Multiple Vitamin (MULTIVITAMIN) capsule Take 1 capsule by mouth daily.      . nitrofurantoin (MACRODANTIN) 50 MG capsule Take 50 mg by mouth daily.    Marland Kitchen NITROSTAT 0.4 MG SL tablet PLACE ONE (1) TABLET UNDER  TONGUE EVERY 5 MINUTES UP TO (3) DOSES AS NEEDED FOR CHEST PAIN. 25 tablet 0  . OVER THE COUNTER MEDICATION Take 1 tablet by mouth daily. Magnesium/Zinc 400/15     No current facility-administered medications for this visit.     Past Medical History:  Diagnosis Date  . Atrial fibrillation (HCC)    permanent afib  . Chest pain 2001   nonobstructive dz by LHC in 2001, recent echocardiogram 2012 ejection fraction 65-70%   . Complete heart block [facility])    [employer] single-lead pacemaker  . Diabetes mellitus   . Hypertension   . Pneumonia    Hospitalized 03/10/2011  . Pulmonary hypertension    PA pressures of 77 mmHg December 2012    Past Surgical History:  Procedure Laterality Date  . ABDOMINAL HYSTERECTOMY    . BREAST SURGERY     60 yrs ago after childbirth breast lanced  . EYE SURGERY     Cataract Sx   . PACEMAKER INSERTION  11/12   [employer] PPM implanted by Dr Lovena Le  . PERMANENT PACEMAKER INSERTION N/A 02/08/2011   Procedure: PERMANENT PACEMAKER INSERTION;  Surgeon: Evans Lance, MD;  Location: [facility] CATH LAB;  Service: Cardiovascular;  Laterality: N/A;    Social History   Social History  . Marital status: Widowed    Spouse  name: N/A  . Number of children: N/A  . Years of education: N/A   Occupational History  . Not on file.   Social History Main Topics  . Smoking status: Never Smoker  . Smokeless tobacco: Never Used  . Alcohol use No  . Drug use: No  . Sexual activity: Not on file   Other Topics Concern  . Not on file   Social History Narrative  . No narrative on file     Vitals:   05/14/16 1537  BP: 110/60  Pulse: 66  SpO2: 99%  Weight: 149 lb (67.6 kg)  Height: 5\' 7"  (1.702 m)    PHYSICAL EXAM General: NAD HEENT: Normal. Neck: No JVD, no thyromegaly. Lungs: Clear to auscultation bilaterally with normal respiratory effort. CV: Nondisplaced PMI.  Regular rate and rhythm, normal S1/S2, no S3/S4, no murmur. No pretibial or  periankle edema.  Venous varicosities b/l.  Abdomen: Soft, nontender, no distention.  Neurologic: Alert and oriented.  Psych: Normal affect. Skin: Normal. Musculoskeletal: No gross deformities.    ECG: Most recent ECG reviewed.      ASSESSMENT AND PLAN: 1. Atrial fibrillation: Decision made by patient and family members to remain off anticoagulants. She is taking aspirin 81 mg daily.  2. Hypertension: Controlled on HCTZ 25 mg and lisinopril 10 mg. I am stopping lisinopril so as to initiate Imdur.  3. CAD with chest pain: Nonobstructive by last cath. On aspirin. I will start Imdur 30 mg.  4. Pacemaker: Followed by Dr. Rayann Heman. Due for interrogation 4/13. I will see if an earlier appt is available.  Dispo: f/u 6 months.  Kate Sable, M.D., F.A.C.C.

## 2016-05-14 NOTE — Patient Instructions (Addendum)
Your physician wants you to follow-up in: 6 months with Dr. Bronson Ing. You will receive a reminder letter in the mail two months in advance. If you don't receive a letter, please call our office to schedule the follow-up appointment.  Your physician recommends that you schedule a follow-up appointment in: With Dr. Rayann Heman in [place].    Your physician has recommended you make the following change in your medication:  Stop Lisinopril Start Imdur (isosorbide) 30mg  daily    Thank you for choosing [facility]!!

## 2016-05-17 DIAGNOSIS — B351 Tinea unguium: Secondary | ICD-10-CM | POA: Diagnosis not present

## 2016-05-17 DIAGNOSIS — E1159 Type 2 diabetes mellitus with other circulatory complications: Secondary | ICD-10-CM | POA: Diagnosis not present

## 2016-05-19 DIAGNOSIS — R05 Cough: Secondary | ICD-10-CM | POA: Diagnosis not present

## 2016-05-21 ENCOUNTER — Encounter: Payer: Self-pay | Admitting: Internal Medicine

## 2016-05-21 ENCOUNTER — Ambulatory Visit (INDEPENDENT_AMBULATORY_CARE_PROVIDER_SITE_OTHER): Payer: Medicare Other | Admitting: Internal Medicine

## 2016-05-21 VITALS — BP 140/68 | HR 65 | Ht 67.0 in | Wt 149.6 lb

## 2016-05-21 DIAGNOSIS — I482 Chronic atrial fibrillation, unspecified: Secondary | ICD-10-CM

## 2016-05-21 DIAGNOSIS — Z950 Presence of cardiac pacemaker: Secondary | ICD-10-CM | POA: Diagnosis not present

## 2016-05-21 DIAGNOSIS — R001 Bradycardia, unspecified: Secondary | ICD-10-CM | POA: Diagnosis not present

## 2016-05-21 NOTE — Progress Notes (Signed)
Electrophysiology Office Note   Date:  05/21/2016   ID:  [name], DOB 30-Jan-1918, MRN [id number]  PCP:  Rory Percy, MD  Cardiologist:  Dr Bronson Ing Primary Electrophysiologist: Thompson Grayer, MD    Chief Complaint  Patient presents with  . Atrial Fibrillation     History of Present Illness: [name] is a 83 y.o. female who presents today for electrophysiology evaluation.   She is doing very well for her age.  She remains active.  She has recently moved to a nursing home.  Her family is adjusting to this. Today, she denies symptoms of palpitations, chest pain, shortness of breath, orthopnea, PND, lower extremity edema, claudication, dizziness, presyncope, syncope, bleeding, or neurologic sequela. The patient is tolerating medications without difficulties and is otherwise without complaint today.    Past Medical History:  Diagnosis Date  . Atrial fibrillation (HCC)    permanent afib  . Chest pain 2001   nonobstructive dz by LHC in 2001, recent echocardiogram 2012 ejection fraction 65-70%   . Complete heart block [facility])    [employer] single-lead pacemaker  . Diabetes mellitus   . Hypertension   . Pneumonia    Hospitalized 03/10/2011  . Pulmonary hypertension    PA pressures of 77 mmHg December 2012   Past Surgical History:  Procedure Laterality Date  . ABDOMINAL HYSTERECTOMY    . BREAST SURGERY     60 yrs ago after childbirth breast lanced  . EYE SURGERY     Cataract Sx   . PACEMAKER INSERTION  11/12   [employer] PPM implanted by Dr Lovena Le  . PERMANENT PACEMAKER INSERTION N/A 02/08/2011   Procedure: PERMANENT PACEMAKER INSERTION;  Surgeon: Evans Lance, MD;  Location: [facility] CATH LAB;  Service: Cardiovascular;  Laterality: N/A;     Current Outpatient Prescriptions  Medication Sig Dispense Refill  . Acetaminophen (TYLENOL PO) Take 650 mg by mouth every 6 (six) hours as needed (pain).    Marland Kitchen aspirin EC 81 MG tablet Take 1 tablet (81 mg total) by  mouth daily.    . Calcium Carbonate-Vitamin D (CALCIUM + D PO) Take 1 tablet by mouth 2 (two) times daily. [place]    . Cholecalciferol (VITAMIN D3) 1000 units CAPS Take 1 capsule by mouth daily.    Marland Kitchen Dextromethorphan-Guaifenesin (MUCINEX DM PO) Take 600 mg by mouth 2 (two) times daily as needed (cough and congestion).    Marland Kitchen esomeprazole (NEXIUM) 40 MG capsule Take 40 mg by mouth daily before breakfast.      . glipiZIDE (GLUCOTROL XL) 5 MG 24 hr tablet Take 5 mg by mouth daily.    . hydrochlorothiazide (HYDRODIURIL) 25 MG tablet Take 25 mg by mouth daily.    . isosorbide mononitrate (IMDUR) 30 MG 24 hr tablet Take 1 tablet (30 mg total) by mouth daily. 90 tablet 3  . loratadine (CLARITIN) 10 MG tablet Take 10 mg by mouth daily.      . Melatonin 5 MG CAPS Take 1 capsule by mouth at bedtime.    . nitrofurantoin (MACRODANTIN) 50 MG capsule Take 50 mg by mouth at bedtime.     . Nutritional Supplements (PROMOD) LIQD Take 30 mLs by mouth 2 (two) times daily.     No current facility-administered medications for this visit.     Allergies:   Aricept [donepezil hcl] and Azithromycin   Social History:  The patient  reports that she has never smoked. She has never used smokeless tobacco. She reports that  she does not drink alcohol or use drugs.   Family History:  The patient's family history includes Coronary artery disease in her brother; Diabetes in her father.    ROS:  Please see the history of present illness.   All other systems are reviewed and negative.    PHYSICAL EXAM: VS:  BP 140/68   Pulse 65   Ht 5\' 7"  (1.702 m)   Wt 149 lb 9.6 oz (67.9 kg)   BMI 23.43 kg/m  , BMI Body mass index is 23.43 kg/m. GEN: elderly , in no acute distress  HEENT: normal  Neck: no JVD, carotid bruits, or masses Cardiac: RRR; no murmurs, rubs, or gallops,no edema  Respiratory:  clear to auscultation bilaterally, normal work of breathing GI: soft, nontender, nondistended, + BS MS: no deformity or atrophy    Skin: warm and dry, device pocket is well healed Neuro:  Strength and sensation are intact Psych: euthymic mood, full affect  Device interrogation is reviewed today in detail.  See PaceArt for details.  Lipid Panel     Component Value Date/Time   CHOL 132 02/05/2011 0556   TRIG 33 02/05/2011 0556   HDL 71 02/05/2011 0556   CHOLHDL 1.9 02/05/2011 0556   VLDL 7 02/05/2011 0556   LDLCALC 54 02/05/2011 0556     Wt Readings from Last 3 Encounters:  05/21/16 149 lb 9.6 oz (67.9 kg)  05/14/16 149 lb (67.6 kg)  05/31/14 146 lb 12.8 oz (66.6 kg)    Other studies Reviewed: Additional studies/ records that were reviewed today include: Dr Raylene Everts note   ASSESSMENT AND PLAN:  1. Complete heart block Normal pacemaker function See Pace Art report No changes today Will enroll in [place]  2. Permanent afib Her CHADS2VASC score is at least 5. She should be anticoagulated long term.  She is clear in her decision to decline anticoagulation.  3. HTN/ CAD Stable No change required today   Return to see me in 1 year in [place]   Current medicines are reviewed at length with the patient today.   The patient does not have concerns regarding her medicines.  The following changes were made today:  none    Signed, Thompson Grayer, MD  05/21/2016 3:31 PM     [facility] [address] [place] [address] [phone] (office) [phone] (fax)

## 2016-05-21 NOTE — Patient Instructions (Signed)
Medication Instructions:  Your physician recommends that you continue on your current medications as directed. Please refer to the Current Medication list given to you today.   Labwork: None ordered   Testing/Procedures: None ordered   Follow-Up: Your physician wants you to follow-up in: 12 months with Dr Rayann Heman in [place] will receive a reminder letter in the mail two months in advance. If you don't receive a letter, please call our office to schedule the follow-up appointment.   Any Other Special Instructions Will Be Listed Below (If Applicable).     If you need a refill on your cardiac medications before your next appointment, please call your pharmacy.

## 2016-05-24 LAB — CUP PACEART INCLINIC DEVICE CHECK
Implantable Lead Model: 4136
Implantable Pulse Generator Implant Date: 20121112
Lead Channel Impedance Value: 658 Ohm
Lead Channel Sensing Intrinsic Amplitude: 13.5 mV
Lead Channel Setting Pacing Pulse Width: 0.4 ms
MDC IDC LEAD IMPLANT DT: 20121112
MDC IDC LEAD LOCATION: 753860
MDC IDC LEAD SERIAL: 29069263
MDC IDC MSMT LEADCHNL RV PACING THRESHOLD AMPLITUDE: 0.7 V
MDC IDC MSMT LEADCHNL RV PACING THRESHOLD PULSEWIDTH: 0.4 ms
MDC IDC SESS DTM: 20180223050000
MDC IDC SET LEADCHNL RV PACING AMPLITUDE: 2.4 V
MDC IDC SET LEADCHNL RV SENSING SENSITIVITY: 2.5 mV
MDC IDC STAT BRADY RV PERCENT PACED: 98 %
Pulse Gen Serial Number: 112815

## 2016-05-27 DIAGNOSIS — Z5181 Encounter for therapeutic drug level monitoring: Secondary | ICD-10-CM | POA: Diagnosis not present

## 2016-05-27 DIAGNOSIS — Z7984 Long term (current) use of oral hypoglycemic drugs: Secondary | ICD-10-CM | POA: Diagnosis not present

## 2016-07-09 ENCOUNTER — Encounter: Payer: Medicare Other | Admitting: Internal Medicine

## 2016-07-23 DIAGNOSIS — A4902 Methicillin resistant Staphylococcus aureus infection, unspecified site: Secondary | ICD-10-CM | POA: Diagnosis not present

## 2016-08-24 ENCOUNTER — Ambulatory Visit (INDEPENDENT_AMBULATORY_CARE_PROVIDER_SITE_OTHER): Payer: Medicare Other | Admitting: *Deleted

## 2016-08-24 DIAGNOSIS — I482 Chronic atrial fibrillation, unspecified: Secondary | ICD-10-CM

## 2016-08-24 DIAGNOSIS — R001 Bradycardia, unspecified: Secondary | ICD-10-CM

## 2016-08-24 NOTE — Progress Notes (Signed)
Remote pacemaker transmission.   

## 2016-08-25 LAB — CUP PACEART REMOTE DEVICE CHECK
Brady Statistic RV Percent Paced: 99 %
Date Time Interrogation Session: 20180530103401
Implantable Lead Model: 4136
Implantable Lead Serial Number: 29069263
Lead Channel Impedance Value: 667 Ohm
Lead Channel Setting Pacing Amplitude: 2.4 V
Lead Channel Setting Pacing Pulse Width: 0.4 ms
Lead Channel Setting Sensing Sensitivity: 2.5 mV
MDC IDC LEAD IMPLANT DT: 20121112
MDC IDC LEAD LOCATION: 753860
MDC IDC PG IMPLANT DT: 20121112
MDC IDC PG SERIAL: 112815

## 2016-08-27 ENCOUNTER — Encounter: Payer: Self-pay | Admitting: Cardiology

## 2016-09-01 ENCOUNTER — Encounter: Payer: Self-pay | Admitting: Adult Health

## 2016-09-01 ENCOUNTER — Ambulatory Visit (INDEPENDENT_AMBULATORY_CARE_PROVIDER_SITE_OTHER): Payer: Medicare Other | Admitting: Adult Health

## 2016-09-01 VITALS — BP 139/70 | HR 60 | Ht 67.0 in | Wt 149.4 lb

## 2016-09-01 DIAGNOSIS — D649 Anemia, unspecified: Secondary | ICD-10-CM | POA: Diagnosis not present

## 2016-09-01 DIAGNOSIS — I482 Chronic atrial fibrillation, unspecified: Secondary | ICD-10-CM

## 2016-09-01 DIAGNOSIS — R609 Edema, unspecified: Secondary | ICD-10-CM | POA: Diagnosis not present

## 2016-09-01 NOTE — Progress Notes (Signed)
Cardiology Office Note   Date:  09/01/2016   ID:  [name], DOB 05/26/1917, MRN [id number]  PCP:  Rory Percy, MD  Cardiologist: Bronson Ing    Chief Complaint  Patient presents with  . Edema  . Atrial Fibrillation      History of Present Illness: [name] is a 83 y.o. female who presents for ongoing assessment and management of with history  Permanent atrial fibrillation that has refused anticoagulation, also history of complete heart block, followed by Dr. Rayann Heman. He has a Engineer, maintenance.she was last seen by Dr. Bronson Ing on 05/14/2016 and was clinically stable. She was seen by Dr. Rayann Heman on 05/24/2016 and was doing well with pacemaker interrogation. She comes today at the request of her daughter due to fluid retention.  The patient's daughter brings her in today due to lower extremity edema which has been occurring over the last 4 days. Although not significant and is been more noticeable to the staff at the nursing home at [facility]. She has significant amount of varicosities in her legs, and often has her legs pain down during the day. She is not very active using a walker for ambulation but mostly sits in a recliner. She does eat some outside food that friends bring to her. She denies pain in her legs, cramping, or numbness or tingling.  Past Medical History:  Diagnosis Date  . Atrial fibrillation (HCC)    permanent afib  . Chest pain 2001   nonobstructive dz by LHC in 2001, recent echocardiogram 2012 ejection fraction 65-70%   . Complete heart block [facility])    [employer] single-lead pacemaker  . Diabetes mellitus   . Hypertension   . Pneumonia    Hospitalized 03/10/2011  . Pulmonary hypertension ([place])    PA pressures of 77 mmHg December 2012    Past Surgical History:  Procedure Laterality Date  . ABDOMINAL HYSTERECTOMY    . BREAST SURGERY     60 yrs ago after childbirth breast lanced  . EYE SURGERY     Cataract Sx   . PACEMAKER  INSERTION  11/12   [employer] PPM implanted by Dr Lovena Le  . PERMANENT PACEMAKER INSERTION N/A 02/08/2011   Procedure: PERMANENT PACEMAKER INSERTION;  Surgeon: Evans Lance, MD;  Location: [facility] CATH LAB;  Service: Cardiovascular;  Laterality: N/A;     Current Outpatient Prescriptions  Medication Sig Dispense Refill  . aspirin EC 81 MG tablet Take 1 tablet (81 mg total) by mouth daily.    . Cholecalciferol (VITAMIN D3) 1000 units CAPS Take 1 capsule by mouth daily.    Marland Kitchen esomeprazole (NEXIUM) 40 MG capsule Take 40 mg by mouth daily before breakfast.      . glipiZIDE (GLUCOTROL XL) 5 MG 24 hr tablet Take 5 mg by mouth daily.    . hydrochlorothiazide (HYDRODIURIL) 25 MG tablet Take 25 mg by mouth daily.    . isosorbide mononitrate (IMDUR) 30 MG 24 hr tablet Take 1 tablet (30 mg total) by mouth daily. 90 tablet 3  . loratadine (CLARITIN) 10 MG tablet Take 10 mg by mouth daily.      . Acetaminophen (TYLENOL PO) Take 650 mg by mouth every 6 (six) hours as needed (pain).    . Calcium Carbonate-Vitamin D (CALCIUM + D PO) Take 1 tablet by mouth 2 (two) times daily. Oscal    . Dextromethorphan-Guaifenesin (MUCINEX DM PO) Take 600 mg by mouth 2 (two) times daily as needed (cough and congestion).    Marland Kitchen  Melatonin 5 MG CAPS Take 1 capsule by mouth at bedtime.    . nitrofurantoin (MACRODANTIN) 50 MG capsule Take 50 mg by mouth at bedtime.     . Nutritional Supplements (PROMOD) LIQD Take 30 mLs by mouth 2 (two) times daily.     No current facility-administered medications for this visit.     Allergies:   Aricept [donepezil hcl] and Azithromycin    Social History:  The patient  reports that she has never smoked. She has never used smokeless tobacco. She reports that she does not drink alcohol or use drugs.   Family History:  The patient's family history includes Coronary artery disease in her brother; Diabetes in her father.    ROS: All other systems are reviewed and negative. Unless otherwise  mentioned in H&P    PHYSICAL EXAM: VS:  Ht 5\' 7"  (1.702 m)   Wt 149 lb 6.4 oz (67.8 kg)   BMI 23.40 kg/m  , BMI Body mass index is 23.4 kg/m. GEN: Well nourished, well developed, in no acute distress  HEENT: normal  Neck: no JVD, carotid bruits, or masses Cardiac: RRR; no murmurs, rubs, or gallops, 1+ pitting edema and ankle and feet. Significant varicosities bilaterally in the lower extremities. Respiratory:  clear to auscultation bilaterally, normal work of breathing GI: soft, nontender, nondistended, + BS MS: no deformity or atrophy  Skin: warm and dry, no rash Neuro:  Strength and sensation are intact Psych: euthymic mood, full affect   Recent Labs: No results found for requested labs within last 8760 hours.    Lipid Panel    Component Value Date/Time   CHOL 132 02/05/2011 0556   TRIG 33 02/05/2011 0556   HDL 71 02/05/2011 0556   CHOLHDL 1.9 02/05/2011 0556   VLDL 7 02/05/2011 0556   LDLCALC 54 02/05/2011 0556      Wt Readings from Last 3 Encounters:  09/01/16 149 lb 6.4 oz (67.8 kg)  05/21/16 149 lb 9.6 oz (67.9 kg)  05/14/16 149 lb (67.6 kg)      Other studies Reviewed: Echocardiogram dated 03/10/2011 LV function was normal with EF of 55-60%, left atrium it was mild to moderately dilated, right ventricle cavity size is normal with normal systolic function. Right atrium mildly dilated. Aortic valve reveals mild aortic leaflet calcification without evidence of aortic regurgitation. Mitral valve leaflets were normal with mild to moderate mitral regurg. There was moderate to severe tricuspid regurgitation.  ASSESSMENT AND PLAN:  1. Lower extremity edema: Multifactorial with significant will lower extremity varicosities, salt intake, and dependent position. I reviewed her medications do not find any medications that can cause significant edema exception of isosorbide. May consider stopping that. For now we'll increase her HCTZ to 25 mg daily alternating with 25 mg  1-1/2 tablet every other day. She is to avoid salty foods. Place TED hose to the knee to assist with venous return in the setting of varicosities. We'll check a CBC and a BMET to evaluate for anemia and kidney function and potassium status on diuretic.  2. Permanent atrial fibrillation: Heart rate is well controlled. She is not on anticoagulation at her request. Continue aspirin 81 mg daily.  3. Single-chamber [employer] pacemaker in situ: Followed per protocol by Dr. Rayann Heman and his team.    Current medicines are reviewed at length with the patient today.    Labs/ tests ordered today include: BMET CBC.  Phill Myron. West Pugh, ANP, AACC   09/01/2016 1:29 PM    [facility] [address] [place], [place] [address] Phone: [phone]; Fax: [phone]

## 2016-09-01 NOTE — Patient Instructions (Signed)
Your physician recommends that you schedule a follow-up appointment in: [place] physician has recommended you make the following change in your medication:   CHANGE HCTZ 1 &1/2 TABLET EVERY OTHER DAY ALTERNATING WITH 1 TABLET EVERY OTHER DAY  Your physician recommends that you return for lab work CBC/BMP  [place]   Thank you for choosing [facility]!!

## 2016-09-22 ENCOUNTER — Ambulatory Visit (INDEPENDENT_AMBULATORY_CARE_PROVIDER_SITE_OTHER): Payer: Medicare Other | Admitting: Cardiovascular Disease

## 2016-09-22 ENCOUNTER — Encounter: Payer: Self-pay | Admitting: Cardiovascular Disease

## 2016-09-22 VITALS — BP 156/67 | HR 68 | Ht 65.0 in | Wt 148.8 lb

## 2016-09-22 DIAGNOSIS — I482 Chronic atrial fibrillation, unspecified: Secondary | ICD-10-CM

## 2016-09-22 DIAGNOSIS — Z950 Presence of cardiac pacemaker: Secondary | ICD-10-CM | POA: Diagnosis not present

## 2016-09-22 DIAGNOSIS — I25118 Atherosclerotic heart disease of native coronary artery with other forms of angina pectoris: Secondary | ICD-10-CM

## 2016-09-22 DIAGNOSIS — I10 Essential (primary) hypertension: Secondary | ICD-10-CM

## 2016-09-22 DIAGNOSIS — R600 Localized edema: Secondary | ICD-10-CM | POA: Diagnosis not present

## 2016-09-22 NOTE — Progress Notes (Signed)
SUBJECTIVE: The patient presents for follow-up. She has a pacemaker for tachycardia-bradycardia syndrome, atrial fibrillation, essential hypertension, pulmonary hypertension, and a history of nonobstructive coronary artery disease.   She saw K. Lawrence DNP on 09/01/16 for lower extremity edema. It was deemed multifactorial in etiology as she has venous varicosities. Hydrochlorothiazide dose was adjusted and she was given TED hose.  I personally reviewed labs obtained on 09/01/16 which demonstrated normal renal function and no evidence of anemia.  She denies chest pain and shortness of breath. She walks with a walker. She is alert but was unable to tell me how many children she has. Her daughter is with her and says the patient has 4 children altogether. Leg swelling is under better control.   Review of Systems: As per "subjective", otherwise negative.  Allergies  Allergen Reactions  . Aricept [Donepezil Hcl] Diarrhea    Severe bloating, stayed in bed for 3 days  . Azithromycin     unknown    Current Outpatient Prescriptions  Medication Sig Dispense Refill  . Acetaminophen (TYLENOL PO) Take 650 mg by mouth every 6 (six) hours as needed (pain).    Marland Kitchen aspirin EC 81 MG tablet Take 1 tablet (81 mg total) by mouth daily.    . Calcium Carbonate-Vitamin D (CALCIUM + D PO) Take 1 tablet by mouth 2 (two) times daily. [place]scal    . Cholecalciferol (VITAMIN D3) 1000 units CAPS Take 1 capsule by mouth daily.    Marland Kitchen Dextromethorphan-Guaifenesin (MUCINEX DM PO) Take 600 mg by mouth 2 (two) times daily as needed (cough and congestion).    Marland Kitchen esomeprazole (NEXIUM) 40 MG capsule Take 40 mg by mouth daily before breakfast.      . glipiZIDE (GLUCOTROL XL) 5 MG 24 hr tablet Take 5 mg by mouth daily.    . hydrochlorothiazide (HYDRODIURIL) 25 MG tablet Take 25 mg by mouth. TAKE 1 TABLET EVERY OTHER DAY ALTERNATING WITH 1 &1/2 TABLET DAILY    . isosorbide mononitrate (IMDUR) 30 MG 24 hr tablet Take 30 mg by  mouth daily.    Marland Kitchen loratadine (CLARITIN) 10 MG tablet Take 10 mg by mouth daily.      . Melatonin 5 MG CAPS Take 1 capsule by mouth at bedtime.    . metroNIDAZOLE (METROGEL) 1 % gel Apply 1 application topically 2 (two) times daily.    . nitrofurantoin (MACRODANTIN) 50 MG capsule Take 50 mg by mouth at bedtime.     . Nutritional Supplements (PROMOD) LIQD Take 30 mLs by mouth 2 (two) times daily.    . phenylephrine-shark liver oil-mineral oil-petrolatum (PREPARATION H) 0.25-14-74.9 % rectal ointment Place 1 application rectally 2 (two) times daily as needed for hemorrhoids.    . triamcinolone cream (KENALOG) 0.1 % Apply 1 application topically 2 (two) times daily.    . vitamin E 400 UNIT capsule Take 400 Units by mouth 2 (two) times daily.     No current facility-administered medications for this visit.     Past Medical History:  Diagnosis Date  . Atrial fibrillation (HCC)    permanent afib  . Chest pain 2001   nonobstructive dz by LHC in 2001, recent echocardiogram 2012 ejection fraction 65-70%   . Complete heart block [facility])    [employer] single-lead pacemaker  . Diabetes mellitus   . Hypertension   . Pneumonia    Hospitalized 03/10/2011  . Pulmonary hypertension ([place])    PA pressures of 77 mmHg December 2012  Past Surgical History:  Procedure Laterality Date  . ABDOMINAL HYSTERECTOMY    . BREAST SURGERY     60 yrs ago after childbirth breast lanced  . EYE SURGERY     Cataract Sx   . PACEMAKER INSERTION  11/12   [employer] PPM implanted by Dr Lovena Le  . PERMANENT PACEMAKER INSERTION N/A 02/08/2011   Procedure: PERMANENT PACEMAKER INSERTION;  Surgeon: Evans Lance, MD;  Location: [facility] CATH LAB;  Service: Cardiovascular;  Laterality: N/A;    Social History   Social History  . Marital status: Widowed    Spouse name: N/A  . Number of children: N/A  . Years of education: N/A   Occupational History  . Not on file.   Social History Main Topics  . Smoking  status: Never Smoker  . Smokeless tobacco: Never Used  . Alcohol use No  . Drug use: No  . Sexual activity: Not on file   Other Topics Concern  . Not on file   Social History Narrative  . No narrative on file     Vitals:   09/22/16 0843  BP: (!) 156/67  Pulse: 68  SpO2: 97%  Weight: 148 lb 12.8 oz (67.5 kg)  Height: 5\' 5"  (1.651 m)    Wt Readings from Last 3 Encounters:  09/22/16 148 lb 12.8 oz (67.5 kg)  09/01/16 149 lb 6.4 oz (67.8 kg)  05/21/16 149 lb 9.6 oz (67.9 kg)     PHYSICAL EXAM General: NAD HEENT: Normal. Neck: No JVD, no thyromegaly. Lungs: Faint end expiratory wheezes at the bases. CV: Nondisplaced PMI.  Regular rate and rhythm, normal S1/S2, no S3/S4, no murmur. Wearing knee high compression stockings. No edema. Abdomen: Soft, no distention.  Neurologic: Alert.  Psych: Normal affect. Skin: Normal. Musculoskeletal: No gross deformities.    ECG: Most recent ECG reviewed.   Labs: Lab Results  Component Value Date/Time   K 4.3 02/05/2011 12:03 AM   BUN 13 02/05/2011 12:03 AM   CREATININE 0.69 02/05/2011 12:03 AM   ALT 23 02/05/2011 12:03 AM   TSH 5.561 (H) 02/05/2011 12:03 AM   HGB 14.3 02/10/2011 06:35 AM     Lipids: Lab Results  Component Value Date/Time   LDLCALC 54 02/05/2011 05:56 AM   CHOL 132 02/05/2011 05:56 AM   TRIG 33 02/05/2011 05:56 AM   HDL 71 02/05/2011 05:56 AM       ASSESSMENT AND PLAN:  1. Atrial fibrillation: Decision previously made by patient and family members to remain off anticoagulants. She is on aspirin.  2. Hypertension: Blood pressure is elevated. I may consider resuming lisinopril at lower dose of 5 mg daily in the future. Continue HCTZ at present dose.  3. CAD with chest pain: Nonobstructive by last cath. Continue aspirin and nitrates.  4. Pacemaker: Normal pacemaker function when interrogated on 05/21/16.  5. Lower extremity edema: Well controlled with diuretic adjustment and TED  hose.   Disposition: Follow up 6 months.  Kate Sable, M.D., F.A.C.C.

## 2016-09-22 NOTE — Patient Instructions (Signed)

## 2016-10-12 DIAGNOSIS — L570 Actinic keratosis: Secondary | ICD-10-CM | POA: Diagnosis not present

## 2016-10-12 DIAGNOSIS — C44329 Squamous cell carcinoma of skin of other parts of face: Secondary | ICD-10-CM | POA: Diagnosis not present

## 2016-11-23 ENCOUNTER — Ambulatory Visit (INDEPENDENT_AMBULATORY_CARE_PROVIDER_SITE_OTHER): Payer: Medicare Other | Admitting: *Deleted

## 2016-11-23 DIAGNOSIS — R001 Bradycardia, unspecified: Secondary | ICD-10-CM | POA: Diagnosis not present

## 2016-11-23 NOTE — Progress Notes (Signed)
Remote pacemaker transmission.   

## 2016-11-24 LAB — CUP PACEART REMOTE DEVICE CHECK
Battery Remaining Longevity: 66 mo
Battery Remaining Percentage: 76 %
Date Time Interrogation Session: 20180828063100
Implantable Lead Location: 753860
Implantable Lead Serial Number: 29069263
Implantable Pulse Generator Implant Date: 20121112
Lead Channel Impedance Value: 566 Ohm
Lead Channel Setting Pacing Amplitude: 2.4 V
Lead Channel Setting Sensing Sensitivity: 2.5 mV
MDC IDC LEAD IMPLANT DT: 20121112
MDC IDC SET LEADCHNL RV PACING PULSEWIDTH: 0.4 ms
MDC IDC STAT BRADY RV PERCENT PACED: 99 %
Pulse Gen Serial Number: 112815

## 2016-11-30 DIAGNOSIS — M6281 Muscle weakness (generalized): Secondary | ICD-10-CM | POA: Diagnosis not present

## 2016-12-01 DIAGNOSIS — M6281 Muscle weakness (generalized): Secondary | ICD-10-CM | POA: Diagnosis not present

## 2016-12-01 DIAGNOSIS — Z7984 Long term (current) use of oral hypoglycemic drugs: Secondary | ICD-10-CM | POA: Diagnosis not present

## 2016-12-01 DIAGNOSIS — Z5181 Encounter for therapeutic drug level monitoring: Secondary | ICD-10-CM | POA: Diagnosis not present

## 2016-12-02 DIAGNOSIS — M6281 Muscle weakness (generalized): Secondary | ICD-10-CM | POA: Diagnosis not present

## 2016-12-03 ENCOUNTER — Encounter: Payer: Self-pay | Admitting: Cardiology

## 2016-12-03 DIAGNOSIS — M6281 Muscle weakness (generalized): Secondary | ICD-10-CM | POA: Diagnosis not present

## 2016-12-06 DIAGNOSIS — M6281 Muscle weakness (generalized): Secondary | ICD-10-CM | POA: Diagnosis not present

## 2016-12-07 DIAGNOSIS — M6281 Muscle weakness (generalized): Secondary | ICD-10-CM | POA: Diagnosis not present

## 2016-12-08 DIAGNOSIS — M6281 Muscle weakness (generalized): Secondary | ICD-10-CM | POA: Diagnosis not present

## 2016-12-09 DIAGNOSIS — M6281 Muscle weakness (generalized): Secondary | ICD-10-CM | POA: Diagnosis not present

## 2016-12-10 DIAGNOSIS — M6281 Muscle weakness (generalized): Secondary | ICD-10-CM | POA: Diagnosis not present

## 2016-12-13 DIAGNOSIS — M6281 Muscle weakness (generalized): Secondary | ICD-10-CM | POA: Diagnosis not present

## 2016-12-14 DIAGNOSIS — M6281 Muscle weakness (generalized): Secondary | ICD-10-CM | POA: Diagnosis not present

## 2016-12-15 DIAGNOSIS — M6281 Muscle weakness (generalized): Secondary | ICD-10-CM | POA: Diagnosis not present

## 2016-12-16 DIAGNOSIS — M6281 Muscle weakness (generalized): Secondary | ICD-10-CM | POA: Diagnosis not present

## 2016-12-17 DIAGNOSIS — M6281 Muscle weakness (generalized): Secondary | ICD-10-CM | POA: Diagnosis not present

## 2016-12-20 DIAGNOSIS — M6281 Muscle weakness (generalized): Secondary | ICD-10-CM | POA: Diagnosis not present

## 2016-12-21 DIAGNOSIS — M6281 Muscle weakness (generalized): Secondary | ICD-10-CM | POA: Diagnosis not present

## 2016-12-22 DIAGNOSIS — M6281 Muscle weakness (generalized): Secondary | ICD-10-CM | POA: Diagnosis not present

## 2016-12-23 DIAGNOSIS — M6281 Muscle weakness (generalized): Secondary | ICD-10-CM | POA: Diagnosis not present

## 2016-12-27 DIAGNOSIS — M6281 Muscle weakness (generalized): Secondary | ICD-10-CM | POA: Diagnosis not present

## 2016-12-28 DIAGNOSIS — M6281 Muscle weakness (generalized): Secondary | ICD-10-CM | POA: Diagnosis not present

## 2017-02-08 DIAGNOSIS — M2011 Hallux valgus (acquired), right foot: Secondary | ICD-10-CM | POA: Diagnosis not present

## 2017-02-08 DIAGNOSIS — Z7984 Long term (current) use of oral hypoglycemic drugs: Secondary | ICD-10-CM | POA: Diagnosis not present

## 2017-02-08 DIAGNOSIS — B351 Tinea unguium: Secondary | ICD-10-CM | POA: Diagnosis not present

## 2017-02-08 DIAGNOSIS — E1151 Type 2 diabetes mellitus with diabetic peripheral angiopathy without gangrene: Secondary | ICD-10-CM | POA: Diagnosis not present

## 2017-02-22 ENCOUNTER — Ambulatory Visit (INDEPENDENT_AMBULATORY_CARE_PROVIDER_SITE_OTHER): Payer: Medicare Other | Admitting: *Deleted

## 2017-02-22 DIAGNOSIS — R001 Bradycardia, unspecified: Secondary | ICD-10-CM

## 2017-02-22 NOTE — Progress Notes (Signed)
Remote pacemaker transmission.   

## 2017-02-25 ENCOUNTER — Encounter: Payer: Self-pay | Admitting: Cardiology

## 2017-03-01 DIAGNOSIS — R05 Cough: Secondary | ICD-10-CM | POA: Diagnosis not present

## 2017-03-01 DIAGNOSIS — H353133 Nonexudative age-related macular degeneration, bilateral, advanced atrophic without subfoveal involvement: Secondary | ICD-10-CM | POA: Diagnosis not present

## 2017-03-01 DIAGNOSIS — R0989 Other specified symptoms and signs involving the circulatory and respiratory systems: Secondary | ICD-10-CM | POA: Diagnosis not present

## 2017-03-01 DIAGNOSIS — Z79899 Other long term (current) drug therapy: Secondary | ICD-10-CM | POA: Diagnosis not present

## 2017-03-01 DIAGNOSIS — Z7984 Long term (current) use of oral hypoglycemic drugs: Secondary | ICD-10-CM | POA: Diagnosis not present

## 2017-03-01 DIAGNOSIS — Z5181 Encounter for therapeutic drug level monitoring: Secondary | ICD-10-CM | POA: Diagnosis not present

## 2017-03-01 DIAGNOSIS — R52 Pain, unspecified: Secondary | ICD-10-CM | POA: Diagnosis not present

## 2017-03-01 DIAGNOSIS — H26493 Other secondary cataract, bilateral: Secondary | ICD-10-CM | POA: Diagnosis not present

## 2017-03-01 DIAGNOSIS — E119 Type 2 diabetes mellitus without complications: Secondary | ICD-10-CM | POA: Diagnosis not present

## 2017-03-01 DIAGNOSIS — Z7982 Long term (current) use of aspirin: Secondary | ICD-10-CM | POA: Diagnosis not present

## 2017-03-07 LAB — CUP PACEART REMOTE DEVICE CHECK
Battery Remaining Longevity: 60 mo
Battery Remaining Percentage: 71 %
Date Time Interrogation Session: 20181127073100
Implantable Lead Model: 4136
Implantable Lead Serial Number: 29069263
Implantable Pulse Generator Implant Date: 20121112
Lead Channel Impedance Value: 577 Ohm
Lead Channel Setting Pacing Amplitude: 2.4 V
Lead Channel Setting Sensing Sensitivity: 2.5 mV
MDC IDC LEAD IMPLANT DT: 20121112
MDC IDC LEAD LOCATION: 753860
MDC IDC PG SERIAL: 112815
MDC IDC SET LEADCHNL RV PACING PULSEWIDTH: 0.4 ms
MDC IDC STAT BRADY RV PERCENT PACED: 99 %

## 2017-03-28 DIAGNOSIS — Z5181 Encounter for therapeutic drug level monitoring: Secondary | ICD-10-CM | POA: Diagnosis not present

## 2017-03-28 DIAGNOSIS — Z7984 Long term (current) use of oral hypoglycemic drugs: Secondary | ICD-10-CM | POA: Diagnosis not present

## 2017-03-28 DIAGNOSIS — Z79899 Other long term (current) drug therapy: Secondary | ICD-10-CM | POA: Diagnosis not present

## 2017-03-28 DIAGNOSIS — R05 Cough: Secondary | ICD-10-CM | POA: Diagnosis not present

## 2017-03-28 DIAGNOSIS — J811 Chronic pulmonary edema: Secondary | ICD-10-CM | POA: Diagnosis not present

## 2017-03-28 DIAGNOSIS — Z7982 Long term (current) use of aspirin: Secondary | ICD-10-CM | POA: Diagnosis not present

## 2017-03-28 DIAGNOSIS — R0989 Other specified symptoms and signs involving the circulatory and respiratory systems: Secondary | ICD-10-CM | POA: Diagnosis not present

## 2017-03-28 DIAGNOSIS — R52 Pain, unspecified: Secondary | ICD-10-CM | POA: Diagnosis not present

## 2017-04-02 DIAGNOSIS — I509 Heart failure, unspecified: Secondary | ICD-10-CM | POA: Diagnosis not present

## 2017-04-02 DIAGNOSIS — Z7982 Long term (current) use of aspirin: Secondary | ICD-10-CM | POA: Diagnosis not present

## 2017-04-02 DIAGNOSIS — E871 Hypo-osmolality and hyponatremia: Secondary | ICD-10-CM | POA: Diagnosis not present

## 2017-04-02 DIAGNOSIS — K219 Gastro-esophageal reflux disease without esophagitis: Secondary | ICD-10-CM | POA: Diagnosis not present

## 2017-04-02 DIAGNOSIS — I5031 Acute diastolic (congestive) heart failure: Secondary | ICD-10-CM | POA: Diagnosis not present

## 2017-04-02 DIAGNOSIS — E860 Dehydration: Secondary | ICD-10-CM | POA: Diagnosis not present

## 2017-04-02 DIAGNOSIS — Z66 Do not resuscitate: Secondary | ICD-10-CM | POA: Diagnosis not present

## 2017-04-02 DIAGNOSIS — Z79899 Other long term (current) drug therapy: Secondary | ICD-10-CM | POA: Diagnosis not present

## 2017-04-02 DIAGNOSIS — E119 Type 2 diabetes mellitus without complications: Secondary | ICD-10-CM | POA: Diagnosis not present

## 2017-04-02 DIAGNOSIS — I10 Essential (primary) hypertension: Secondary | ICD-10-CM | POA: Diagnosis not present

## 2017-04-02 DIAGNOSIS — I110 Hypertensive heart disease with heart failure: Secondary | ICD-10-CM | POA: Diagnosis not present

## 2017-04-02 DIAGNOSIS — R531 Weakness: Secondary | ICD-10-CM | POA: Diagnosis not present

## 2017-04-02 DIAGNOSIS — J90 Pleural effusion, not elsewhere classified: Secondary | ICD-10-CM | POA: Diagnosis not present

## 2017-04-02 DIAGNOSIS — R404 Transient alteration of awareness: Secondary | ICD-10-CM | POA: Diagnosis not present

## 2017-04-02 DIAGNOSIS — E1129 Type 2 diabetes mellitus with other diabetic kidney complication: Secondary | ICD-10-CM | POA: Diagnosis not present

## 2017-04-02 DIAGNOSIS — E876 Hypokalemia: Secondary | ICD-10-CM | POA: Diagnosis not present

## 2017-04-02 DIAGNOSIS — I4891 Unspecified atrial fibrillation: Secondary | ICD-10-CM | POA: Diagnosis not present

## 2017-04-02 DIAGNOSIS — Z7984 Long term (current) use of oral hypoglycemic drugs: Secondary | ICD-10-CM | POA: Diagnosis not present

## 2017-04-02 DIAGNOSIS — Z950 Presence of cardiac pacemaker: Secondary | ICD-10-CM | POA: Diagnosis not present

## 2017-04-07 DIAGNOSIS — I519 Heart disease, unspecified: Secondary | ICD-10-CM | POA: Diagnosis not present

## 2017-04-08 DIAGNOSIS — I519 Heart disease, unspecified: Secondary | ICD-10-CM | POA: Diagnosis not present

## 2017-04-09 DIAGNOSIS — I519 Heart disease, unspecified: Secondary | ICD-10-CM | POA: Diagnosis not present

## 2017-04-10 DIAGNOSIS — E875 Hyperkalemia: Secondary | ICD-10-CM | POA: Diagnosis not present

## 2017-04-10 DIAGNOSIS — I519 Heart disease, unspecified: Secondary | ICD-10-CM | POA: Diagnosis not present

## 2017-04-11 DIAGNOSIS — I519 Heart disease, unspecified: Secondary | ICD-10-CM | POA: Diagnosis not present

## 2017-04-12 DIAGNOSIS — I519 Heart disease, unspecified: Secondary | ICD-10-CM | POA: Diagnosis not present

## 2017-04-13 DIAGNOSIS — I519 Heart disease, unspecified: Secondary | ICD-10-CM | POA: Diagnosis not present

## 2017-04-14 DIAGNOSIS — I519 Heart disease, unspecified: Secondary | ICD-10-CM | POA: Diagnosis not present

## 2017-04-15 DIAGNOSIS — I519 Heart disease, unspecified: Secondary | ICD-10-CM | POA: Diagnosis not present

## 2017-04-16 DIAGNOSIS — I519 Heart disease, unspecified: Secondary | ICD-10-CM | POA: Diagnosis not present

## 2017-04-17 DIAGNOSIS — J189 Pneumonia, unspecified organism: Secondary | ICD-10-CM | POA: Diagnosis not present

## 2017-04-17 DIAGNOSIS — I519 Heart disease, unspecified: Secondary | ICD-10-CM | POA: Diagnosis not present

## 2017-04-18 DIAGNOSIS — I519 Heart disease, unspecified: Secondary | ICD-10-CM | POA: Diagnosis not present

## 2017-04-19 DIAGNOSIS — I519 Heart disease, unspecified: Secondary | ICD-10-CM | POA: Diagnosis not present

## 2017-04-20 DIAGNOSIS — I519 Heart disease, unspecified: Secondary | ICD-10-CM | POA: Diagnosis not present

## 2017-04-21 DIAGNOSIS — I519 Heart disease, unspecified: Secondary | ICD-10-CM | POA: Diagnosis not present

## 2017-04-22 ENCOUNTER — Telehealth: Payer: Self-pay | Admitting: Internal Medicine

## 2017-04-29 NOTE — Telephone Encounter (Signed)
Spoke with Roselyn Reef she stated that pt is with Hospice and they have given her 3-5 days, she stated that Dr. Rayann Heman and talked about turning the pacemaker off at last [place]. Informed Roselyn Reef that we don't turn pacemakers off and eventually the pt's heart pumping function would stop and the pacemaker wouldn't make any difference. Roselyn Reef voiced understanding.

## 2017-04-29 NOTE — Telephone Encounter (Signed)
[name] [facility]) called stating that her grandmother is very weak. She has a question about patient's pacemaker  Please call [phone]

## 2017-04-29 DEATH — deceased

## 2017-06-17 ENCOUNTER — Encounter: Payer: Medicare Other | Admitting: Internal Medicine
